# Patient Record
Sex: Female | Born: 1945 | Race: White | Hispanic: No | Marital: Married | Smoking: Former smoker
Health system: Southern US, Community
[De-identification: ages and names within clinical notes are randomized; demographics above are authoritative.]

## PROBLEM LIST (undated history)

## (undated) DIAGNOSIS — I10 Essential (primary) hypertension: Secondary | ICD-10-CM

## (undated) DIAGNOSIS — E785 Hyperlipidemia, unspecified: Secondary | ICD-10-CM

## (undated) HISTORY — PX: PLACEMENT OF BREAST IMPLANTS: SHX6334

---

## 2010-08-23 ENCOUNTER — Encounter: Payer: Self-pay | Admitting: Family Medicine

## 2011-09-16 DIAGNOSIS — I10 Essential (primary) hypertension: Secondary | ICD-10-CM | POA: Diagnosis not present

## 2011-09-16 DIAGNOSIS — E7800 Pure hypercholesterolemia, unspecified: Secondary | ICD-10-CM | POA: Diagnosis not present

## 2012-07-31 DIAGNOSIS — Z23 Encounter for immunization: Secondary | ICD-10-CM | POA: Diagnosis not present

## 2012-11-15 DIAGNOSIS — I10 Essential (primary) hypertension: Secondary | ICD-10-CM | POA: Diagnosis not present

## 2012-11-15 DIAGNOSIS — Z23 Encounter for immunization: Secondary | ICD-10-CM | POA: Diagnosis not present

## 2012-11-15 DIAGNOSIS — E7800 Pure hypercholesterolemia, unspecified: Secondary | ICD-10-CM | POA: Diagnosis not present

## 2012-11-15 DIAGNOSIS — Z1331 Encounter for screening for depression: Secondary | ICD-10-CM | POA: Diagnosis not present

## 2013-01-02 DIAGNOSIS — M109 Gout, unspecified: Secondary | ICD-10-CM | POA: Diagnosis not present

## 2013-06-01 DIAGNOSIS — R7309 Other abnormal glucose: Secondary | ICD-10-CM | POA: Diagnosis not present

## 2013-06-01 DIAGNOSIS — E7800 Pure hypercholesterolemia, unspecified: Secondary | ICD-10-CM | POA: Diagnosis not present

## 2013-06-01 DIAGNOSIS — M109 Gout, unspecified: Secondary | ICD-10-CM | POA: Diagnosis not present

## 2013-06-01 DIAGNOSIS — I10 Essential (primary) hypertension: Secondary | ICD-10-CM | POA: Diagnosis not present

## 2013-06-01 DIAGNOSIS — E782 Mixed hyperlipidemia: Secondary | ICD-10-CM | POA: Diagnosis not present

## 2013-08-17 ENCOUNTER — Other Ambulatory Visit: Payer: Self-pay | Admitting: Family Medicine

## 2013-08-17 ENCOUNTER — Ambulatory Visit
Admission: RE | Admit: 2013-08-17 | Discharge: 2013-08-17 | Disposition: A | Discharge status: Home / Self Care | Payer: Medicare Other | Source: Ambulatory Visit | Attending: Family Medicine | Admitting: Family Medicine

## 2013-08-17 DIAGNOSIS — M79609 Pain in unspecified limb: Secondary | ICD-10-CM | POA: Diagnosis not present

## 2013-08-17 DIAGNOSIS — R609 Edema, unspecified: Secondary | ICD-10-CM

## 2013-08-17 DIAGNOSIS — M7989 Other specified soft tissue disorders: Secondary | ICD-10-CM | POA: Diagnosis not present

## 2013-08-17 DIAGNOSIS — M25539 Pain in unspecified wrist: Secondary | ICD-10-CM | POA: Diagnosis not present

## 2013-08-17 DIAGNOSIS — R52 Pain, unspecified: Secondary | ICD-10-CM

## 2013-09-21 DIAGNOSIS — M25539 Pain in unspecified wrist: Secondary | ICD-10-CM | POA: Diagnosis not present

## 2014-08-30 DIAGNOSIS — Z0000 Encounter for general adult medical examination without abnormal findings: Secondary | ICD-10-CM | POA: Diagnosis not present

## 2014-08-30 DIAGNOSIS — Z1389 Encounter for screening for other disorder: Secondary | ICD-10-CM | POA: Diagnosis not present

## 2014-08-30 DIAGNOSIS — Z9119 Patient's noncompliance with other medical treatment and regimen: Secondary | ICD-10-CM | POA: Diagnosis not present

## 2014-08-30 DIAGNOSIS — N183 Chronic kidney disease, stage 3 (moderate): Secondary | ICD-10-CM | POA: Diagnosis not present

## 2014-08-30 DIAGNOSIS — Z23 Encounter for immunization: Secondary | ICD-10-CM | POA: Diagnosis not present

## 2014-08-30 DIAGNOSIS — R739 Hyperglycemia, unspecified: Secondary | ICD-10-CM | POA: Diagnosis not present

## 2014-08-30 DIAGNOSIS — E780 Pure hypercholesterolemia: Secondary | ICD-10-CM | POA: Diagnosis not present

## 2014-08-30 DIAGNOSIS — I129 Hypertensive chronic kidney disease with stage 1 through stage 4 chronic kidney disease, or unspecified chronic kidney disease: Secondary | ICD-10-CM | POA: Diagnosis not present

## 2014-08-30 DIAGNOSIS — M109 Gout, unspecified: Secondary | ICD-10-CM | POA: Diagnosis not present

## 2015-03-13 DIAGNOSIS — R739 Hyperglycemia, unspecified: Secondary | ICD-10-CM | POA: Diagnosis not present

## 2015-03-13 DIAGNOSIS — E780 Pure hypercholesterolemia: Secondary | ICD-10-CM | POA: Diagnosis not present

## 2015-03-13 DIAGNOSIS — N183 Chronic kidney disease, stage 3 (moderate): Secondary | ICD-10-CM | POA: Diagnosis not present

## 2015-03-13 DIAGNOSIS — I129 Hypertensive chronic kidney disease with stage 1 through stage 4 chronic kidney disease, or unspecified chronic kidney disease: Secondary | ICD-10-CM | POA: Diagnosis not present

## 2015-03-26 IMAGING — CR DG WRIST COMPLETE 3+V*R*
4 series · 4 of 4 positions shown · non-contrast
Comparison: None.

CLINICAL DATA: Medial wrist pain, prior history of gout

EXAM:
RIGHT WRIST - COMPLETE 3+ VIEW

[view not recorded (1 of 4)]
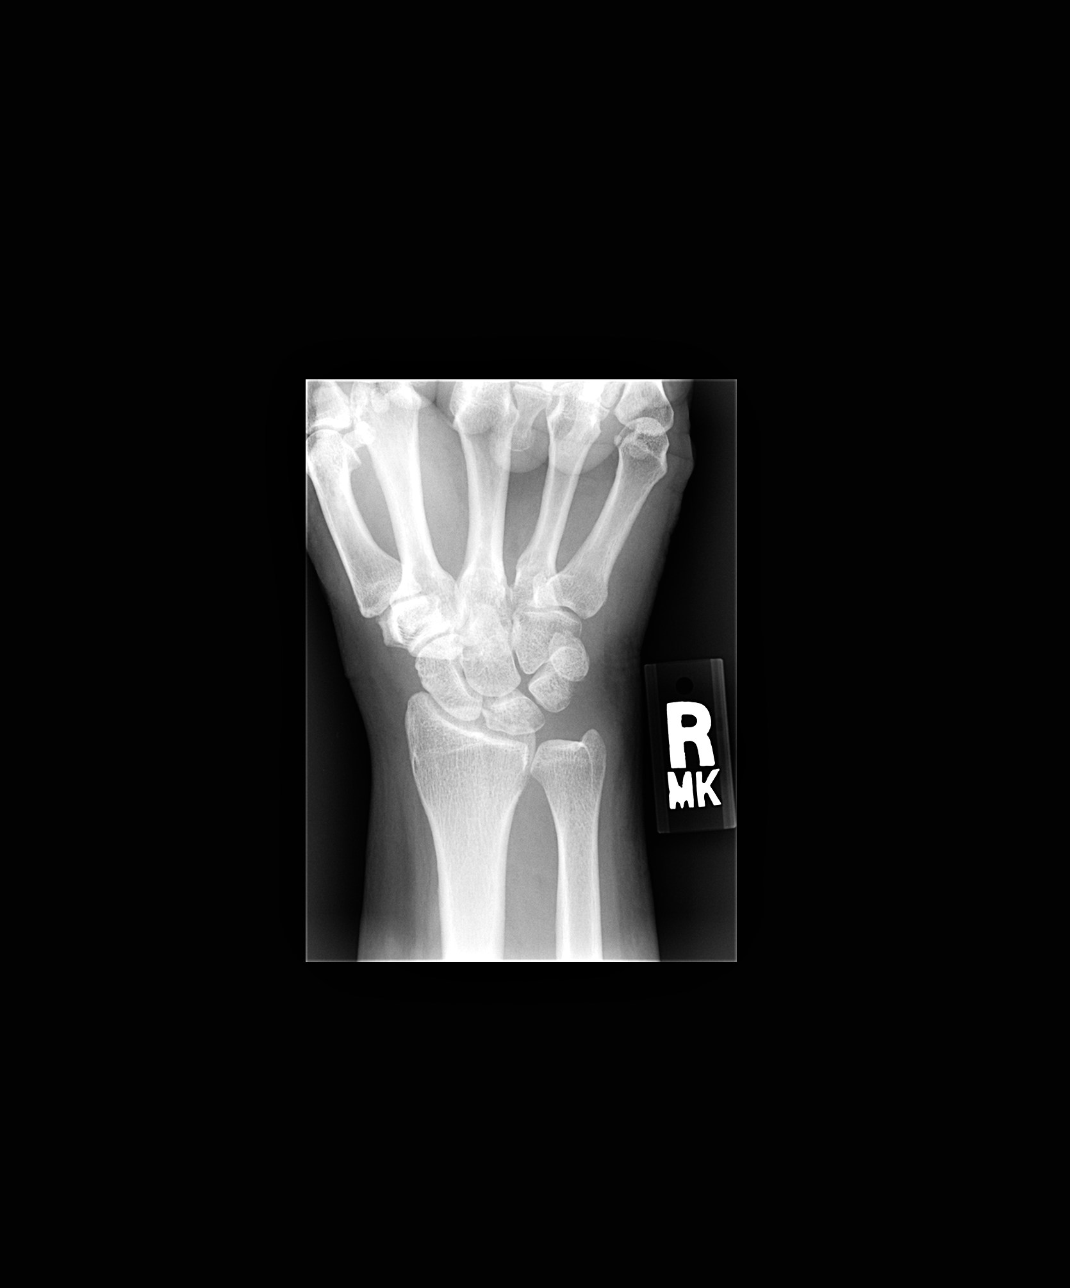

[view not recorded (2 of 4)]
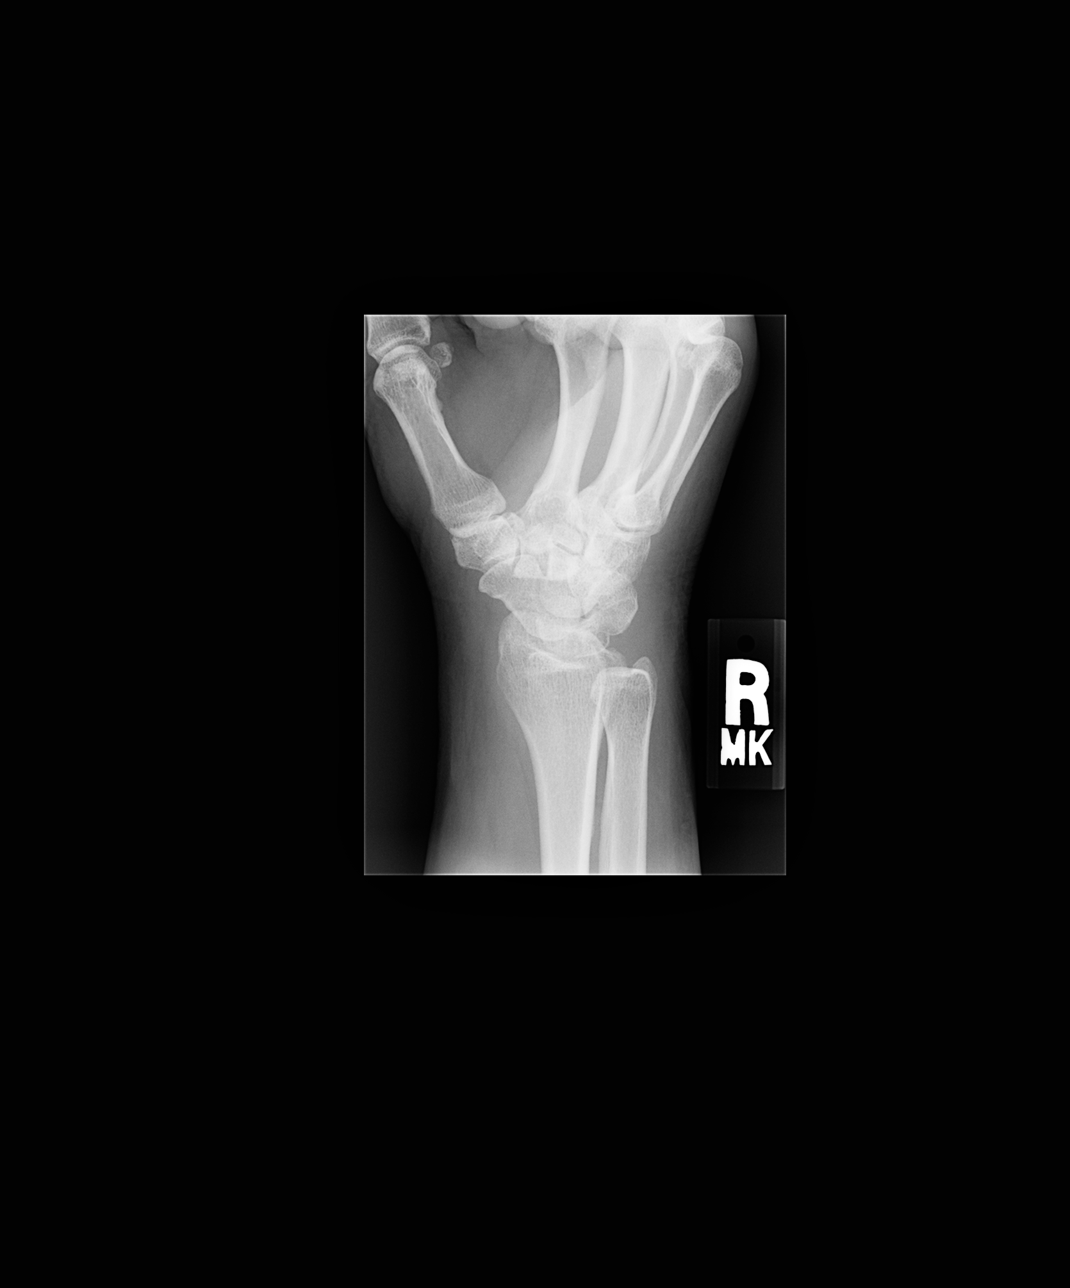

[view not recorded (3 of 4)]
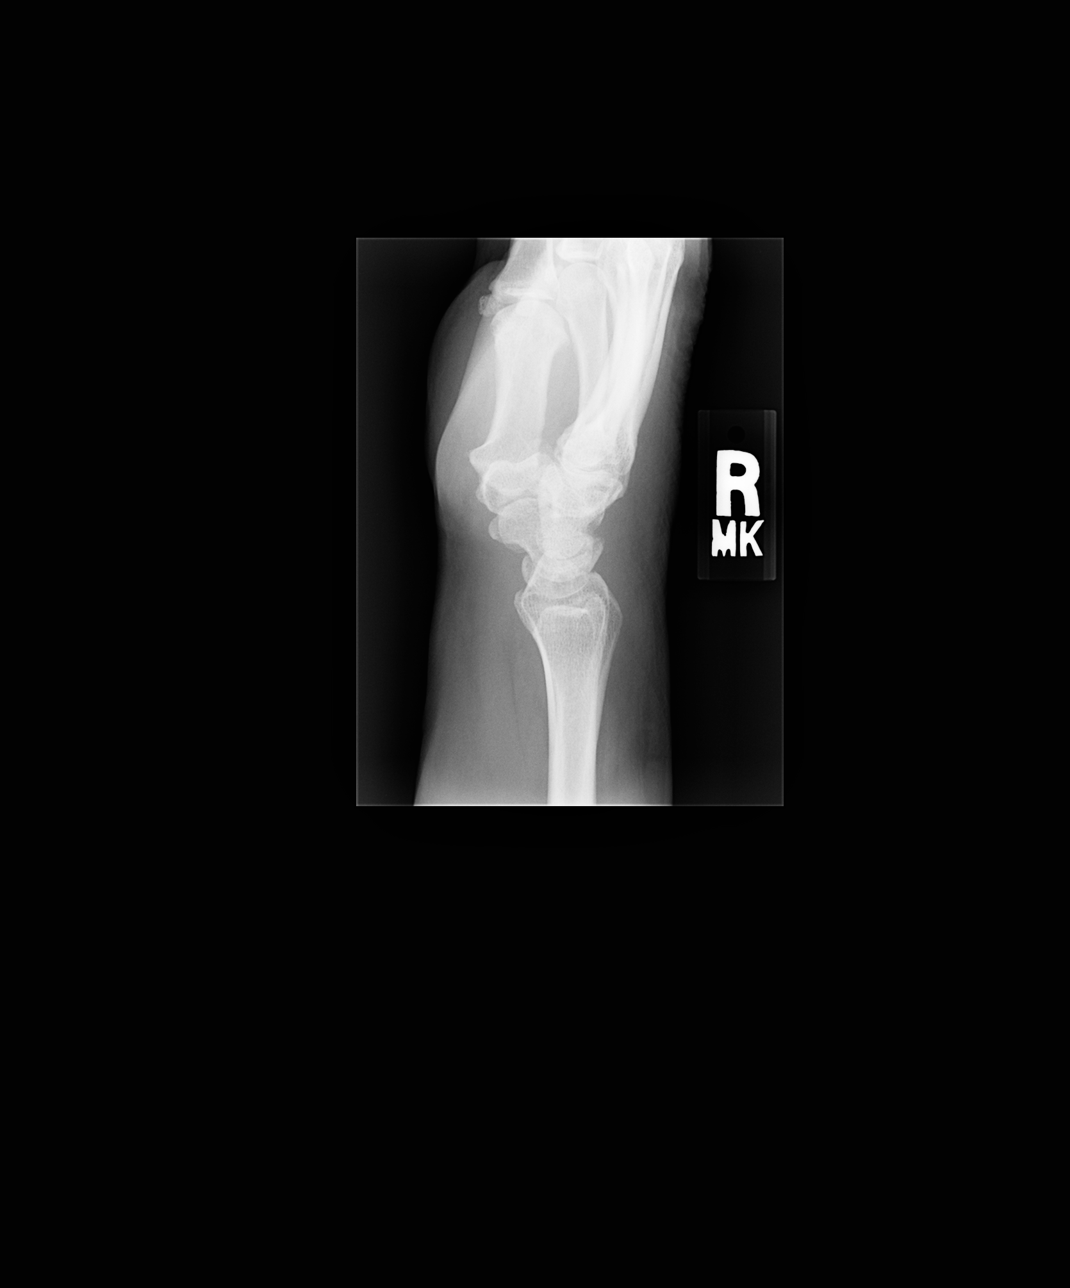

[view not recorded (4 of 4)]
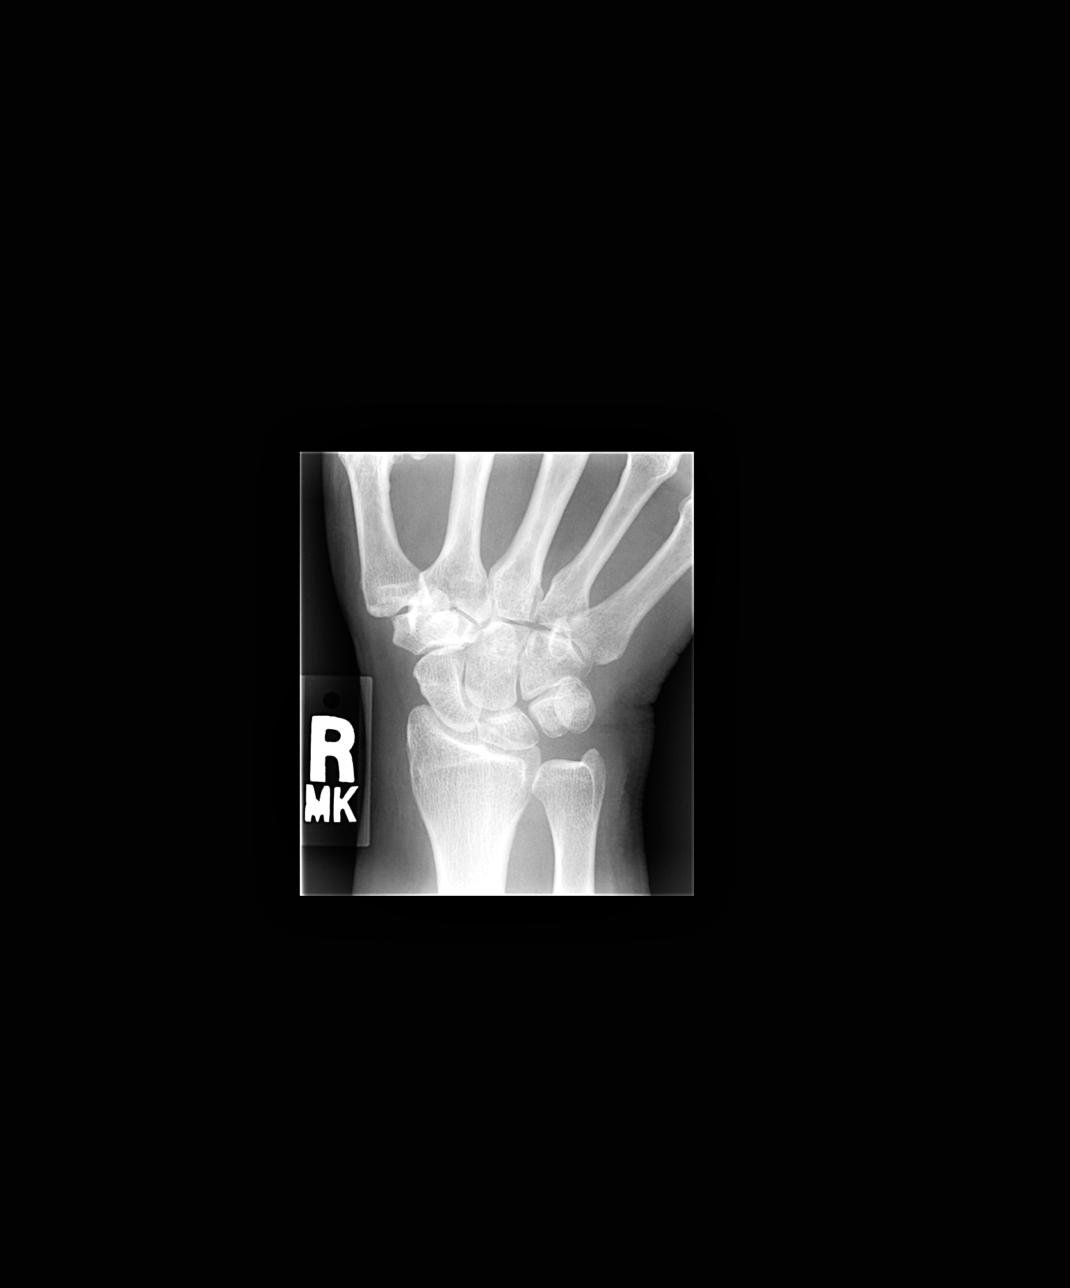

[4 of 4 positions shown; findings below may reference images not displayed]

FINDINGS: No acute fracture or malalignment. The carpus is intact and
congruent. There is a diffuse soft tissue swelling about the wrist.
Normal bony mineralization. No erosive changes.
IMPRESSION: Diffuse soft tissue swelling about the wrist without acute bony
abnormality. If there is clinical concern for soft tissue injury,
consider wrist MRI.

## 2015-05-29 DIAGNOSIS — N183 Chronic kidney disease, stage 3 (moderate): Secondary | ICD-10-CM | POA: Diagnosis not present

## 2015-05-29 DIAGNOSIS — M25431 Effusion, right wrist: Secondary | ICD-10-CM | POA: Diagnosis not present

## 2015-05-29 DIAGNOSIS — M25531 Pain in right wrist: Secondary | ICD-10-CM | POA: Diagnosis not present

## 2015-05-29 DIAGNOSIS — R5383 Other fatigue: Secondary | ICD-10-CM | POA: Diagnosis not present

## 2015-11-12 DIAGNOSIS — I129 Hypertensive chronic kidney disease with stage 1 through stage 4 chronic kidney disease, or unspecified chronic kidney disease: Secondary | ICD-10-CM | POA: Diagnosis not present

## 2015-11-12 DIAGNOSIS — E7800 Pure hypercholesterolemia, unspecified: Secondary | ICD-10-CM | POA: Diagnosis not present

## 2015-11-12 DIAGNOSIS — Z0000 Encounter for general adult medical examination without abnormal findings: Secondary | ICD-10-CM | POA: Diagnosis not present

## 2015-11-12 DIAGNOSIS — N183 Chronic kidney disease, stage 3 (moderate): Secondary | ICD-10-CM | POA: Diagnosis not present

## 2015-11-12 DIAGNOSIS — M109 Gout, unspecified: Secondary | ICD-10-CM | POA: Diagnosis not present

## 2015-11-12 DIAGNOSIS — Z9119 Patient's noncompliance with other medical treatment and regimen: Secondary | ICD-10-CM | POA: Diagnosis not present

## 2015-11-12 DIAGNOSIS — Z1389 Encounter for screening for other disorder: Secondary | ICD-10-CM | POA: Diagnosis not present

## 2015-11-12 DIAGNOSIS — R739 Hyperglycemia, unspecified: Secondary | ICD-10-CM | POA: Diagnosis not present

## 2016-01-13 DIAGNOSIS — I129 Hypertensive chronic kidney disease with stage 1 through stage 4 chronic kidney disease, or unspecified chronic kidney disease: Secondary | ICD-10-CM | POA: Diagnosis not present

## 2016-01-13 DIAGNOSIS — N183 Chronic kidney disease, stage 3 (moderate): Secondary | ICD-10-CM | POA: Diagnosis not present

## 2016-05-20 DIAGNOSIS — E669 Obesity, unspecified: Secondary | ICD-10-CM | POA: Diagnosis not present

## 2016-05-20 DIAGNOSIS — R739 Hyperglycemia, unspecified: Secondary | ICD-10-CM | POA: Diagnosis not present

## 2016-05-20 DIAGNOSIS — N183 Chronic kidney disease, stage 3 (moderate): Secondary | ICD-10-CM | POA: Diagnosis not present

## 2016-05-20 DIAGNOSIS — I129 Hypertensive chronic kidney disease with stage 1 through stage 4 chronic kidney disease, or unspecified chronic kidney disease: Secondary | ICD-10-CM | POA: Diagnosis not present

## 2016-05-20 DIAGNOSIS — E7800 Pure hypercholesterolemia, unspecified: Secondary | ICD-10-CM | POA: Diagnosis not present

## 2016-07-05 DIAGNOSIS — M2550 Pain in unspecified joint: Secondary | ICD-10-CM | POA: Diagnosis not present

## 2016-07-05 DIAGNOSIS — N183 Chronic kidney disease, stage 3 (moderate): Secondary | ICD-10-CM | POA: Diagnosis not present

## 2016-07-05 DIAGNOSIS — M109 Gout, unspecified: Secondary | ICD-10-CM | POA: Diagnosis not present

## 2016-07-05 DIAGNOSIS — I129 Hypertensive chronic kidney disease with stage 1 through stage 4 chronic kidney disease, or unspecified chronic kidney disease: Secondary | ICD-10-CM | POA: Diagnosis not present

## 2016-07-05 DIAGNOSIS — E7800 Pure hypercholesterolemia, unspecified: Secondary | ICD-10-CM | POA: Diagnosis not present

## 2016-07-28 DIAGNOSIS — M109 Gout, unspecified: Secondary | ICD-10-CM | POA: Diagnosis not present

## 2016-09-07 DIAGNOSIS — N183 Chronic kidney disease, stage 3 (moderate): Secondary | ICD-10-CM | POA: Diagnosis not present

## 2016-09-07 DIAGNOSIS — M109 Gout, unspecified: Secondary | ICD-10-CM | POA: Diagnosis not present

## 2016-09-07 DIAGNOSIS — E7800 Pure hypercholesterolemia, unspecified: Secondary | ICD-10-CM | POA: Diagnosis not present

## 2016-09-07 DIAGNOSIS — I129 Hypertensive chronic kidney disease with stage 1 through stage 4 chronic kidney disease, or unspecified chronic kidney disease: Secondary | ICD-10-CM | POA: Diagnosis not present

## 2016-12-14 DIAGNOSIS — R739 Hyperglycemia, unspecified: Secondary | ICD-10-CM | POA: Diagnosis not present

## 2016-12-14 DIAGNOSIS — I129 Hypertensive chronic kidney disease with stage 1 through stage 4 chronic kidney disease, or unspecified chronic kidney disease: Secondary | ICD-10-CM | POA: Diagnosis not present

## 2016-12-14 DIAGNOSIS — Z0000 Encounter for general adult medical examination without abnormal findings: Secondary | ICD-10-CM | POA: Diagnosis not present

## 2016-12-14 DIAGNOSIS — E7800 Pure hypercholesterolemia, unspecified: Secondary | ICD-10-CM | POA: Diagnosis not present

## 2016-12-14 DIAGNOSIS — M109 Gout, unspecified: Secondary | ICD-10-CM | POA: Diagnosis not present

## 2016-12-14 DIAGNOSIS — N183 Chronic kidney disease, stage 3 (moderate): Secondary | ICD-10-CM | POA: Diagnosis not present

## 2016-12-14 DIAGNOSIS — Z1389 Encounter for screening for other disorder: Secondary | ICD-10-CM | POA: Diagnosis not present

## 2017-07-08 DIAGNOSIS — E669 Obesity, unspecified: Secondary | ICD-10-CM | POA: Diagnosis not present

## 2017-07-08 DIAGNOSIS — R739 Hyperglycemia, unspecified: Secondary | ICD-10-CM | POA: Diagnosis not present

## 2017-07-08 DIAGNOSIS — N183 Chronic kidney disease, stage 3 (moderate): Secondary | ICD-10-CM | POA: Diagnosis not present

## 2017-07-08 DIAGNOSIS — I129 Hypertensive chronic kidney disease with stage 1 through stage 4 chronic kidney disease, or unspecified chronic kidney disease: Secondary | ICD-10-CM | POA: Diagnosis not present

## 2017-07-08 DIAGNOSIS — M25569 Pain in unspecified knee: Secondary | ICD-10-CM | POA: Diagnosis not present

## 2018-01-16 DIAGNOSIS — I129 Hypertensive chronic kidney disease with stage 1 through stage 4 chronic kidney disease, or unspecified chronic kidney disease: Secondary | ICD-10-CM | POA: Diagnosis not present

## 2018-01-16 DIAGNOSIS — E7800 Pure hypercholesterolemia, unspecified: Secondary | ICD-10-CM | POA: Diagnosis not present

## 2018-01-16 DIAGNOSIS — Z0000 Encounter for general adult medical examination without abnormal findings: Secondary | ICD-10-CM | POA: Diagnosis not present

## 2018-01-16 DIAGNOSIS — Z1159 Encounter for screening for other viral diseases: Secondary | ICD-10-CM | POA: Diagnosis not present

## 2018-01-16 DIAGNOSIS — M109 Gout, unspecified: Secondary | ICD-10-CM | POA: Diagnosis not present

## 2018-01-16 DIAGNOSIS — Z1389 Encounter for screening for other disorder: Secondary | ICD-10-CM | POA: Diagnosis not present

## 2018-01-16 DIAGNOSIS — R739 Hyperglycemia, unspecified: Secondary | ICD-10-CM | POA: Diagnosis not present

## 2018-01-16 DIAGNOSIS — N183 Chronic kidney disease, stage 3 (moderate): Secondary | ICD-10-CM | POA: Diagnosis not present

## 2018-07-18 DIAGNOSIS — E7800 Pure hypercholesterolemia, unspecified: Secondary | ICD-10-CM | POA: Diagnosis not present

## 2018-07-18 DIAGNOSIS — E161 Other hypoglycemia: Secondary | ICD-10-CM | POA: Diagnosis not present

## 2018-07-18 DIAGNOSIS — N183 Chronic kidney disease, stage 3 (moderate): Secondary | ICD-10-CM | POA: Diagnosis not present

## 2018-07-18 DIAGNOSIS — E669 Obesity, unspecified: Secondary | ICD-10-CM | POA: Diagnosis not present

## 2018-07-18 DIAGNOSIS — M25569 Pain in unspecified knee: Secondary | ICD-10-CM | POA: Diagnosis not present

## 2018-07-18 DIAGNOSIS — I129 Hypertensive chronic kidney disease with stage 1 through stage 4 chronic kidney disease, or unspecified chronic kidney disease: Secondary | ICD-10-CM | POA: Diagnosis not present

## 2018-08-07 DIAGNOSIS — M1711 Unilateral primary osteoarthritis, right knee: Secondary | ICD-10-CM | POA: Diagnosis not present

## 2018-08-07 DIAGNOSIS — M25561 Pain in right knee: Secondary | ICD-10-CM | POA: Diagnosis not present

## 2018-08-07 DIAGNOSIS — M25562 Pain in left knee: Secondary | ICD-10-CM | POA: Diagnosis not present

## 2018-08-07 DIAGNOSIS — M1712 Unilateral primary osteoarthritis, left knee: Secondary | ICD-10-CM | POA: Diagnosis not present

## 2018-08-10 DIAGNOSIS — M170 Bilateral primary osteoarthritis of knee: Secondary | ICD-10-CM | POA: Diagnosis not present

## 2018-08-15 DIAGNOSIS — M170 Bilateral primary osteoarthritis of knee: Secondary | ICD-10-CM | POA: Diagnosis not present

## 2018-08-21 DIAGNOSIS — M170 Bilateral primary osteoarthritis of knee: Secondary | ICD-10-CM | POA: Diagnosis not present

## 2018-08-24 DIAGNOSIS — M170 Bilateral primary osteoarthritis of knee: Secondary | ICD-10-CM | POA: Diagnosis not present

## 2018-08-28 DIAGNOSIS — M170 Bilateral primary osteoarthritis of knee: Secondary | ICD-10-CM | POA: Diagnosis not present

## 2018-08-31 DIAGNOSIS — M170 Bilateral primary osteoarthritis of knee: Secondary | ICD-10-CM | POA: Diagnosis not present

## 2018-09-04 DIAGNOSIS — M170 Bilateral primary osteoarthritis of knee: Secondary | ICD-10-CM | POA: Diagnosis not present

## 2018-09-13 DIAGNOSIS — M170 Bilateral primary osteoarthritis of knee: Secondary | ICD-10-CM | POA: Diagnosis not present

## 2018-09-22 DIAGNOSIS — L812 Freckles: Secondary | ICD-10-CM | POA: Diagnosis not present

## 2018-09-22 DIAGNOSIS — L821 Other seborrheic keratosis: Secondary | ICD-10-CM | POA: Diagnosis not present

## 2018-09-22 DIAGNOSIS — L820 Inflamed seborrheic keratosis: Secondary | ICD-10-CM | POA: Diagnosis not present

## 2019-03-16 DIAGNOSIS — E7800 Pure hypercholesterolemia, unspecified: Secondary | ICD-10-CM | POA: Diagnosis not present

## 2019-03-16 DIAGNOSIS — M109 Gout, unspecified: Secondary | ICD-10-CM | POA: Diagnosis not present

## 2019-03-16 DIAGNOSIS — E669 Obesity, unspecified: Secondary | ICD-10-CM | POA: Diagnosis not present

## 2019-03-16 DIAGNOSIS — I129 Hypertensive chronic kidney disease with stage 1 through stage 4 chronic kidney disease, or unspecified chronic kidney disease: Secondary | ICD-10-CM | POA: Diagnosis not present

## 2019-03-16 DIAGNOSIS — R7303 Prediabetes: Secondary | ICD-10-CM | POA: Diagnosis not present

## 2019-03-16 DIAGNOSIS — Z0000 Encounter for general adult medical examination without abnormal findings: Secondary | ICD-10-CM | POA: Diagnosis not present

## 2019-03-16 DIAGNOSIS — Z1389 Encounter for screening for other disorder: Secondary | ICD-10-CM | POA: Diagnosis not present

## 2019-03-16 DIAGNOSIS — N183 Chronic kidney disease, stage 3 (moderate): Secondary | ICD-10-CM | POA: Diagnosis not present

## 2019-03-22 DIAGNOSIS — M109 Gout, unspecified: Secondary | ICD-10-CM | POA: Diagnosis not present

## 2019-03-22 DIAGNOSIS — E7800 Pure hypercholesterolemia, unspecified: Secondary | ICD-10-CM | POA: Diagnosis not present

## 2019-03-22 DIAGNOSIS — R7303 Prediabetes: Secondary | ICD-10-CM | POA: Diagnosis not present

## 2019-03-22 DIAGNOSIS — Z0000 Encounter for general adult medical examination without abnormal findings: Secondary | ICD-10-CM | POA: Diagnosis not present

## 2019-09-19 DIAGNOSIS — N183 Chronic kidney disease, stage 3 unspecified: Secondary | ICD-10-CM | POA: Diagnosis not present

## 2019-09-19 DIAGNOSIS — E7800 Pure hypercholesterolemia, unspecified: Secondary | ICD-10-CM | POA: Diagnosis not present

## 2019-09-19 DIAGNOSIS — E669 Obesity, unspecified: Secondary | ICD-10-CM | POA: Diagnosis not present

## 2019-09-19 DIAGNOSIS — I129 Hypertensive chronic kidney disease with stage 1 through stage 4 chronic kidney disease, or unspecified chronic kidney disease: Secondary | ICD-10-CM | POA: Diagnosis not present

## 2019-09-19 DIAGNOSIS — Z7189 Other specified counseling: Secondary | ICD-10-CM | POA: Diagnosis not present

## 2019-09-19 DIAGNOSIS — R7303 Prediabetes: Secondary | ICD-10-CM | POA: Diagnosis not present

## 2019-09-25 DIAGNOSIS — I129 Hypertensive chronic kidney disease with stage 1 through stage 4 chronic kidney disease, or unspecified chronic kidney disease: Secondary | ICD-10-CM | POA: Diagnosis not present

## 2019-09-25 DIAGNOSIS — R7303 Prediabetes: Secondary | ICD-10-CM | POA: Diagnosis not present

## 2019-09-25 DIAGNOSIS — E7800 Pure hypercholesterolemia, unspecified: Secondary | ICD-10-CM | POA: Diagnosis not present

## 2020-03-20 DIAGNOSIS — Z0000 Encounter for general adult medical examination without abnormal findings: Secondary | ICD-10-CM | POA: Diagnosis not present

## 2020-03-20 DIAGNOSIS — R7303 Prediabetes: Secondary | ICD-10-CM | POA: Diagnosis not present

## 2020-03-20 DIAGNOSIS — N183 Chronic kidney disease, stage 3 unspecified: Secondary | ICD-10-CM | POA: Diagnosis not present

## 2020-03-20 DIAGNOSIS — M109 Gout, unspecified: Secondary | ICD-10-CM | POA: Diagnosis not present

## 2020-03-20 DIAGNOSIS — Z1389 Encounter for screening for other disorder: Secondary | ICD-10-CM | POA: Diagnosis not present

## 2020-03-20 DIAGNOSIS — E669 Obesity, unspecified: Secondary | ICD-10-CM | POA: Diagnosis not present

## 2020-03-20 DIAGNOSIS — Z9119 Patient's noncompliance with other medical treatment and regimen: Secondary | ICD-10-CM | POA: Diagnosis not present

## 2020-03-20 DIAGNOSIS — I129 Hypertensive chronic kidney disease with stage 1 through stage 4 chronic kidney disease, or unspecified chronic kidney disease: Secondary | ICD-10-CM | POA: Diagnosis not present

## 2020-03-20 DIAGNOSIS — E7800 Pure hypercholesterolemia, unspecified: Secondary | ICD-10-CM | POA: Diagnosis not present

## 2020-05-28 ENCOUNTER — Ambulatory Visit: Payer: Medicare Other | Attending: Internal Medicine

## 2020-05-28 ENCOUNTER — Other Ambulatory Visit (HOSPITAL_COMMUNITY): Payer: Self-pay | Admitting: Internal Medicine

## 2020-05-28 DIAGNOSIS — Z23 Encounter for immunization: Secondary | ICD-10-CM

## 2020-05-28 NOTE — Progress Notes (Signed)
   Covid-19 Vaccination Clinic  Name:  Deborah Logan    MRN: [id number] DOB: 1945/11/06  05/28/2020  Deborah Logan was observed post Covid-19 immunization for 15 minutes without incident. She was provided with Vaccine Information Sheet and instruction to access the V-Safe system.   Deborah Logan was instructed to call 911 with any severe reactions post vaccine: Marland Kitchen Difficulty breathing  . Swelling of face and throat  . A fast heartbeat  . A bad rash all over body  . Dizziness and weakness

## 2020-09-22 DIAGNOSIS — E7800 Pure hypercholesterolemia, unspecified: Secondary | ICD-10-CM | POA: Diagnosis not present

## 2020-09-22 DIAGNOSIS — R7303 Prediabetes: Secondary | ICD-10-CM | POA: Diagnosis not present

## 2020-09-22 DIAGNOSIS — N183 Chronic kidney disease, stage 3 unspecified: Secondary | ICD-10-CM | POA: Diagnosis not present

## 2020-09-22 DIAGNOSIS — I129 Hypertensive chronic kidney disease with stage 1 through stage 4 chronic kidney disease, or unspecified chronic kidney disease: Secondary | ICD-10-CM | POA: Diagnosis not present

## 2020-12-20 DIAGNOSIS — Z23 Encounter for immunization: Secondary | ICD-10-CM | POA: Diagnosis not present

## 2021-04-21 DIAGNOSIS — N183 Chronic kidney disease, stage 3 unspecified: Secondary | ICD-10-CM | POA: Diagnosis not present

## 2021-04-21 DIAGNOSIS — E7800 Pure hypercholesterolemia, unspecified: Secondary | ICD-10-CM | POA: Diagnosis not present

## 2021-04-21 DIAGNOSIS — M109 Gout, unspecified: Secondary | ICD-10-CM | POA: Diagnosis not present

## 2021-04-21 DIAGNOSIS — E669 Obesity, unspecified: Secondary | ICD-10-CM | POA: Diagnosis not present

## 2021-04-21 DIAGNOSIS — Z0000 Encounter for general adult medical examination without abnormal findings: Secondary | ICD-10-CM | POA: Diagnosis not present

## 2021-04-21 DIAGNOSIS — I129 Hypertensive chronic kidney disease with stage 1 through stage 4 chronic kidney disease, or unspecified chronic kidney disease: Secondary | ICD-10-CM | POA: Diagnosis not present

## 2021-04-21 DIAGNOSIS — Z1389 Encounter for screening for other disorder: Secondary | ICD-10-CM | POA: Diagnosis not present

## 2021-04-21 DIAGNOSIS — R7303 Prediabetes: Secondary | ICD-10-CM | POA: Diagnosis not present

## 2021-04-21 DIAGNOSIS — R2689 Other abnormalities of gait and mobility: Secondary | ICD-10-CM | POA: Diagnosis not present

## 2021-05-14 DIAGNOSIS — Z23 Encounter for immunization: Secondary | ICD-10-CM | POA: Diagnosis not present

## 2021-10-09 DIAGNOSIS — N183 Chronic kidney disease, stage 3 unspecified: Secondary | ICD-10-CM | POA: Diagnosis not present

## 2021-10-09 DIAGNOSIS — E7800 Pure hypercholesterolemia, unspecified: Secondary | ICD-10-CM | POA: Diagnosis not present

## 2021-10-09 DIAGNOSIS — R7303 Prediabetes: Secondary | ICD-10-CM | POA: Diagnosis not present

## 2021-10-09 DIAGNOSIS — I129 Hypertensive chronic kidney disease with stage 1 through stage 4 chronic kidney disease, or unspecified chronic kidney disease: Secondary | ICD-10-CM | POA: Diagnosis not present

## 2021-12-09 DIAGNOSIS — G720 Drug-induced myopathy: Secondary | ICD-10-CM | POA: Diagnosis not present

## 2021-12-09 DIAGNOSIS — R7303 Prediabetes: Secondary | ICD-10-CM | POA: Diagnosis not present

## 2021-12-09 DIAGNOSIS — E7800 Pure hypercholesterolemia, unspecified: Secondary | ICD-10-CM | POA: Diagnosis not present

## 2022-04-15 DIAGNOSIS — L821 Other seborrheic keratosis: Secondary | ICD-10-CM | POA: Diagnosis not present

## 2022-04-23 DIAGNOSIS — Z1211 Encounter for screening for malignant neoplasm of colon: Secondary | ICD-10-CM | POA: Diagnosis not present

## 2022-04-23 DIAGNOSIS — R7309 Other abnormal glucose: Secondary | ICD-10-CM | POA: Diagnosis not present

## 2022-04-23 DIAGNOSIS — N183 Chronic kidney disease, stage 3 unspecified: Secondary | ICD-10-CM | POA: Diagnosis not present

## 2022-04-23 DIAGNOSIS — M109 Gout, unspecified: Secondary | ICD-10-CM | POA: Diagnosis not present

## 2022-04-23 DIAGNOSIS — R7303 Prediabetes: Secondary | ICD-10-CM | POA: Diagnosis not present

## 2022-04-23 DIAGNOSIS — E7800 Pure hypercholesterolemia, unspecified: Secondary | ICD-10-CM | POA: Diagnosis not present

## 2022-04-23 DIAGNOSIS — Z1331 Encounter for screening for depression: Secondary | ICD-10-CM | POA: Diagnosis not present

## 2022-04-23 DIAGNOSIS — Z0000 Encounter for general adult medical examination without abnormal findings: Secondary | ICD-10-CM | POA: Diagnosis not present

## 2022-04-23 DIAGNOSIS — I129 Hypertensive chronic kidney disease with stage 1 through stage 4 chronic kidney disease, or unspecified chronic kidney disease: Secondary | ICD-10-CM | POA: Diagnosis not present

## 2022-04-23 DIAGNOSIS — E669 Obesity, unspecified: Secondary | ICD-10-CM | POA: Diagnosis not present

## 2022-05-17 DIAGNOSIS — Z23 Encounter for immunization: Secondary | ICD-10-CM | POA: Diagnosis not present

## 2022-06-12 ENCOUNTER — Encounter (HOSPITAL_COMMUNITY): Payer: Self-pay

## 2022-06-12 ENCOUNTER — Emergency Department (HOSPITAL_COMMUNITY): Payer: Medicare Other

## 2022-06-12 ENCOUNTER — Observation Stay (HOSPITAL_COMMUNITY)
Admission: EM | Admit: 2022-06-12 | Discharge: 2022-06-14 | Disposition: A | Discharge status: Home / Self Care | Payer: Medicare Other | Attending: Internal Medicine | Admitting: Internal Medicine

## 2022-06-12 ENCOUNTER — Other Ambulatory Visit: Payer: Self-pay

## 2022-06-12 DIAGNOSIS — R7989 Other specified abnormal findings of blood chemistry: Secondary | ICD-10-CM

## 2022-06-12 DIAGNOSIS — N1832 Chronic kidney disease, stage 3b: Secondary | ICD-10-CM | POA: Diagnosis not present

## 2022-06-12 DIAGNOSIS — I10 Essential (primary) hypertension: Secondary | ICD-10-CM

## 2022-06-12 DIAGNOSIS — R079 Chest pain, unspecified: Secondary | ICD-10-CM | POA: Diagnosis not present

## 2022-06-12 DIAGNOSIS — Z79899 Other long term (current) drug therapy: Secondary | ICD-10-CM | POA: Insufficient documentation

## 2022-06-12 DIAGNOSIS — I129 Hypertensive chronic kidney disease with stage 1 through stage 4 chronic kidney disease, or unspecified chronic kidney disease: Secondary | ICD-10-CM | POA: Diagnosis not present

## 2022-06-12 DIAGNOSIS — I161 Hypertensive emergency: Secondary | ICD-10-CM | POA: Diagnosis not present

## 2022-06-12 DIAGNOSIS — R001 Bradycardia, unspecified: Secondary | ICD-10-CM | POA: Diagnosis not present

## 2022-06-12 DIAGNOSIS — I160 Hypertensive urgency: Principal | ICD-10-CM | POA: Insufficient documentation

## 2022-06-12 DIAGNOSIS — Z87891 Personal history of nicotine dependence: Secondary | ICD-10-CM | POA: Insufficient documentation

## 2022-06-12 DIAGNOSIS — R0789 Other chest pain: Secondary | ICD-10-CM | POA: Diagnosis not present

## 2022-06-12 DIAGNOSIS — I493 Ventricular premature depolarization: Secondary | ICD-10-CM | POA: Diagnosis not present

## 2022-06-12 DIAGNOSIS — E785 Hyperlipidemia, unspecified: Secondary | ICD-10-CM

## 2022-06-12 DIAGNOSIS — R778 Other specified abnormalities of plasma proteins: Secondary | ICD-10-CM | POA: Diagnosis not present

## 2022-06-12 DIAGNOSIS — R0602 Shortness of breath: Secondary | ICD-10-CM | POA: Diagnosis not present

## 2022-06-12 HISTORY — DX: Hyperlipidemia, unspecified: E78.5

## 2022-06-12 HISTORY — DX: Essential (primary) hypertension: I10

## 2022-06-12 HISTORY — DX: Chronic kidney disease, stage 3b: N18.32

## 2022-06-12 LAB — CBC WITH DIFFERENTIAL/PLATELET
Abs Immature Granulocytes: 0.04 10*3/uL (ref 0.00–0.07)
Basophils Absolute: 0.1 10*3/uL (ref 0.0–0.1)
Basophils Relative: 1 %
Eosinophils Absolute: 0.2 10*3/uL (ref 0.0–0.5)
Eosinophils Relative: 2 %
HCT: 42.4 % (ref 36.0–46.0)
Hemoglobin: 13.9 g/dL (ref 12.0–15.0)
Immature Granulocytes: 0 %
Lymphocytes Relative: 12 %
Lymphs Abs: 1.6 10*3/uL (ref 0.7–4.0)
MCH: 30.3 pg (ref 26.0–34.0)
MCHC: 32.8 g/dL (ref 30.0–36.0)
MCV: 92.4 fL (ref 80.0–100.0)
Monocytes Absolute: 0.8 10*3/uL (ref 0.1–1.0)
Monocytes Relative: 6 %
Neutro Abs: 10.8 10*3/uL — ABNORMAL HIGH (ref 1.7–7.7)
Neutrophils Relative %: 79 %
Platelets: 383 10*3/uL (ref 150–400)
RBC: 4.59 MIL/uL (ref 3.87–5.11)
RDW: 13.2 % (ref 11.5–15.5)
WBC: 13.5 10*3/uL — ABNORMAL HIGH (ref 4.0–10.5)
nRBC: 0 % (ref 0.0–0.2)

## 2022-06-12 LAB — BASIC METABOLIC PANEL
Anion gap: 12 (ref 5–15)
BUN: 25 mg/dL — ABNORMAL HIGH (ref 8–23)
CO2: 21 mmol/L — ABNORMAL LOW (ref 22–32)
Calcium: 9.7 mg/dL (ref 8.9–10.3)
Chloride: 106 mmol/L (ref 98–111)
Creatinine, Ser: 1.27 mg/dL — ABNORMAL HIGH (ref 0.44–1.00)
GFR, Estimated: 44 mL/min — ABNORMAL LOW (ref >60)
Glucose, Bld: 166 mg/dL — ABNORMAL HIGH (ref 70–99)
Potassium: 4.4 mmol/L (ref 3.5–5.1)
Sodium: 139 mmol/L (ref 135–145)

## 2022-06-12 LAB — MAGNESIUM: Magnesium: 1.9 mg/dL (ref 1.7–2.4)

## 2022-06-12 LAB — TROPONIN I (HIGH SENSITIVITY)
Troponin I (High Sensitivity): 14 ng/L (ref <18)
Troponin I (High Sensitivity): 18 ng/L — ABNORMAL HIGH (ref <18)
Troponin I (High Sensitivity): 21 ng/L — ABNORMAL HIGH (ref <18)

## 2022-06-12 LAB — BRAIN NATRIURETIC PEPTIDE: B Natriuretic Peptide: 928.8 pg/mL — ABNORMAL HIGH (ref 0.0–100.0)

## 2022-06-12 LAB — D-DIMER, QUANTITATIVE: D-Dimer, Quant: 0.93 ug/mL-FEU — ABNORMAL HIGH (ref 0.00–0.50)

## 2022-06-12 MED ORDER — HYDRALAZINE HCL 10 MG PO TABS
10.0000 mg | ORAL_TABLET | Freq: Once | ORAL | Status: AC
  Administered 2022-06-12: 10 mg via ORAL
  Filled 2022-06-12: qty 1

## 2022-06-12 MED ORDER — HYDRALAZINE HCL 25 MG PO TABS
25.0000 mg | ORAL_TABLET | Freq: Once | ORAL | Status: AC
  Administered 2022-06-12: 25 mg via ORAL
  Filled 2022-06-12: qty 1

## 2022-06-12 MED ORDER — IOHEXOL 350 MG/ML SOLN
60.0000 mL | Freq: Once | INTRAVENOUS | Status: AC | PRN
  Administered 2022-06-12: 60 mL via INTRAVENOUS

## 2022-06-12 MED ORDER — NITROGLYCERIN 0.4 MG SL SUBL
0.4000 mg | SUBLINGUAL_TABLET | SUBLINGUAL | Status: DC | PRN
  Administered 2022-06-12 (×2): 0.4 mg via SUBLINGUAL
  Filled 2022-06-12 (×2): qty 1

## 2022-06-12 MED ORDER — ASPIRIN 81 MG PO CHEW
324.0000 mg | CHEWABLE_TABLET | Freq: Once | ORAL | Status: AC
  Administered 2022-06-12: 324 mg via ORAL
  Filled 2022-06-12: qty 4

## 2022-06-12 NOTE — ED Notes (Signed)
Pt given snacks and water per MD

## 2022-06-12 NOTE — Assessment & Plan Note (Signed)
Continue atenolol and benazepril.

## 2022-06-12 NOTE — ED Provider Triage Note (Signed)
Emergency Medicine Provider Triage Evaluation Note  Deborah Logan , a 76 y.o. female  was evaluated in triage.  Pt complains of chest pain.  Located under left breast.  Does not radiate to left arm, left jaw.  Associated shortness of breath.  Pain worse with exertion, resolves with rest.  No history of similar.  No pain or swelling to calves.  No history of PE or DVT.  Review of Systems  Positive: CP, SOB Negative: Le edema, pain  Physical Exam  There were no vitals taken for this visit. Gen:   Awake, no distress   Resp:  Normal effort  MSK:   Moves extremities without difficulty  Other:    Medical Decision Making  Medically screening exam initiated at 3:48 PM.  Appropriate orders placed.  Deborah Logan was informed that the remainder of the evaluation will be completed by another provider, this initial triage assessment does not replace that evaluation, and the importance of remaining in the ED until their evaluation is complete.  CP   Kenyah Luba A, PA-C 06/12/22 1549

## 2022-06-12 NOTE — Assessment & Plan Note (Addendum)
Admit to observation med/tele bed. Pt with clear lung exam. No peripheral edema. Does not have physical signs of volume overload. Will check echo. Need to get better blood control. Pt does admit to missing her medications(atenolol and benazepril) yesterday and taking ibuprofen and ASA for the last 3 days. Add prn IV hydralazine to her home meds. CTPA suggests pulmonary HTN. Check echo.

## 2022-06-12 NOTE — H&P (Signed)
History and Physical    Deborah Logan [id number] DOB: 10-Oct-1945 DOA: 06/12/2022  DOS: the patient was seen and examined on 06/12/2022  PCP: Pcp, No   Patient coming from: Home  I have personally briefly reviewed patient's old medical records in [place]  CC: chest discomfort HPI: 6 76-year-old Caucasian female history of hypertension, hyperlipidemia, CKD stage IIIb presents to the ER today with continued chest pain for the last 3 days.  Patient states last week she was painting her porch.  She thinks she may have overworked her muscles.  She has been having left-sided chest discomfort but no pain for the last 3 days.  She has been taking ibuprofen and aspirin.  She missed a dose of her atenolol and benazepril she takes for hypertension yesterday.  She states that due to continued unrelenting chest discomfort, she presented to the urgent care for evaluation.  She was sent to the ER for evaluation.  Patient denies any shortness of breath.  No nausea.  No vomiting.  No diaphoresis.  No radiation of her chest discomfort to her arms, neck, jaw.  She has no prior history of coronary disease.  On arrival to the ER, temp 98.7 heart rate 70 blood pressure 218/71.  9 9% on room air.  Labs showed a BNP of 928  Magnesium 1.9  D-dimer slightly elevated 0.93  BUN of 25, creatinine 1.27  White count 13.5, hemoglobin 13.9, platelets of 383  CTPA was negative for PE.  There were central pulmonary artery enlargement consistent with pulmonary artery hypertension.  There is cardiomegaly.  EKG shows normal sinus rhythm with occasional bigeminy.    ED Course: BNP 928, CXR negative for pulmonary edema, CTPA negative for PE  Review of Systems:  Review of Systems  Constitutional: Negative.   HENT: Negative.    Eyes: Negative.   Respiratory:  Negative for shortness of breath.   Cardiovascular:  Negative for palpitations, orthopnea, leg swelling and PND.       Chest discomfort but  no pain.  Gastrointestinal: Negative.  Negative for abdominal pain, diarrhea and vomiting.  Genitourinary: Negative.   Musculoskeletal: Negative.   Skin: Negative.   Neurological: Negative.   Endo/Heme/Allergies: Negative.   Psychiatric/Behavioral: Negative.    All other systems reviewed and are negative.   Past Medical History:  Diagnosis Date   HLD (hyperlipidemia)    HTN (hypertension) 06/12/2022   Hyperlipidemia 06/12/2022   Hypertension    Stage 3b chronic kidney disease (CKD) ([place]) 06/12/2022    Past Surgical History:  Procedure Laterality Date   PLACEMENT OF BREAST IMPLANTS Bilateral      reports that she has quit smoking. Her smoking use included cigarettes. She has never used smokeless tobacco. She reports current alcohol use. She reports that she does not use drugs.  No Known Allergies  No family history on file.  Prior to Admission medications   Medication Sig Start Date End Date Taking? Authorizing Provider  allopurinol (ZYLOPRIM) 100 MG tablet Take 100 mg by mouth daily.   Yes [provider]  atenolol (TENORMIN) 50 MG tablet Take 50 mg by mouth daily.   Yes [provider]  benazepril (LOTENSIN) 40 MG tablet Take 40 mg by mouth daily.   Yes [provider]  rosuvastatin (CRESTOR) 5 MG tablet Take 5 mg by mouth daily. 04/19/22  Yes [provider]  vitamin D3 (CHOLECALCIFEROL) 25 MCG tablet Take 1,000 Units by mouth daily.   Yes [provider]  Physical Exam: Vitals:   06/12/22 2015 06/12/22 2209 06/12/22 2230 06/12/22 2245  BP: (!) 191/77 (!) 188/94 (!) 159/72 (!) 155/54  Pulse: 60 62 72 (!) 58  Resp: 12 15 17 14   Temp:      TempSrc:      SpO2: 96% 97% 98% 98%    Physical Exam Vitals and nursing note reviewed.  Constitutional:      General: She is not in acute distress.    Appearance: Normal appearance. She is obese. She is not ill-appearing, toxic-appearing or diaphoretic.  HENT:     Head:  Normocephalic and atraumatic.     Nose: Nose normal.  Eyes:     General: No scleral icterus. Cardiovascular:     Rate and Rhythm: Normal rate and regular rhythm.     Pulses: Normal pulses.     Heart sounds: No murmur heard.    No gallop.  Pulmonary:     Effort: Pulmonary effort is normal. No respiratory distress.     Breath sounds: Normal breath sounds. No wheezing or rales.  Abdominal:     General: Bowel sounds are normal. There is no distension.     Palpations: Abdomen is soft.     Tenderness: There is no abdominal tenderness. There is no guarding or rebound.  Musculoskeletal:     Right lower leg: No edema.     Left lower leg: No edema.  Skin:    General: Skin is warm and dry.     Capillary Refill: Capillary refill takes less than 2 seconds.  Neurological:     General: No focal deficit present.     Mental Status: She is alert and oriented to person, place, and time.      Labs on Admission: I have personally reviewed following labs and imaging studies  CBC: Recent Labs  Lab 06/12/22 1600  WBC 13.5*  NEUTROABS 10.8*  HGB 13.9  HCT 42.4  MCV 92.4  PLT [id number]   Basic Metabolic Panel: Recent Labs  Lab 06/12/22 1600 06/12/22 1808  NA 139  --   K 4.4  --   CL 106  --   CO2 21*  --   GLUCOSE 166*  --   BUN 25*  --   CREATININE 1.27*  --   CALCIUM 9.7  --   MG  --  1.9   GFR: CrCl cannot be calculated (Unknown ideal weight.). Liver Function Tests: No results for input(s): "AST", "ALT", "ALKPHOS", "BILITOT", "PROT", "ALBUMIN" in the last 168 hours. No results for input(s): "LIPASE", "AMYLASE" in the last 168 hours. No results for input(s): "AMMONIA" in the last 168 hours. Coagulation Profile: No results for input(s): "INR", "PROTIME" in the last 168 hours. Cardiac Enzymes: Recent Labs  Lab 06/12/22 1600 06/12/22 1808 06/12/22 2213  TROPONINIHS 14 18* 21*   BNP (last 3 results) No results for input(s): "PROBNP" in the last 8760 hours. HbA1C: No results  for input(s): "HGBA1C" in the last 72 hours. CBG: No results for input(s): "GLUCAP" in the last 168 hours. Lipid Profile: No results for input(s): "CHOL", "HDL", "LDLCALC", "TRIG", "CHOLHDL", "LDLDIRECT" in the last 72 hours. Thyroid Function Tests: No results for input(s): "TSH", "T4TOTAL", "FREET4", "T3FREE", "THYROIDAB" in the last 72 hours. Anemia Panel: No results for input(s): "VITAMINB12", "FOLATE", "FERRITIN", "TIBC", "IRON", "RETICCTPCT" in the last 72 hours. Urine analysis: No results found for: "COLORURINE", "APPEARANCEUR", "LABSPEC", "PHURINE", "GLUCOSEU", "HGBUR", "BILIRUBINUR", "KETONESUR", "PROTEINUR", "UROBILINOGEN", "NITRITE", "LEUKOCYTESUR"  Radiological Exams on Admission: I have personally reviewed images  CT Angio Chest PE W and/or Wo Contrast  Result Date: 06/12/2022 CLINICAL DATA:  Pulmonary embolism (PE) suspected, positive D-dimer. Chest pain. EXAM: CT ANGIOGRAPHY CHEST WITH CONTRAST TECHNIQUE: Multidetector CT imaging of the chest was performed using the standard protocol during bolus administration of intravenous contrast. Multiplanar CT image reconstructions and MIPs were obtained to evaluate the vascular anatomy. RADIATION DOSE REDUCTION: This exam was performed according to the departmental dose-optimization program which includes automated exposure control, adjustment of the mA and/or kV according to patient size and/or use of iterative reconstruction technique. CONTRAST:  6mL OMNIPAQUE IOHEXOL 350 MG/ML SOLN COMPARISON:  None Available. FINDINGS: Cardiovascular: There is adequate opacification of the pulmonary arterial tree. No intraluminal filling defect identified to suggest acute pulmonary embolism. Central pulmonary arteries are enlarged in keeping with changes of pulmonary arterial hypertension. Mild global cardiomegaly. Mild coronary artery calcification. No pericardial effusion. Mild atherosclerotic calcification within the thoracic aorta. No aortic aneurysm.  Mediastinum/Nodes: No enlarged mediastinal, hilar, or axillary lymph nodes. Thyroid gland, trachea, and esophagus demonstrate no significant findings. Lungs/Pleura: Lungs are clear. No pleural effusion or pneumothorax. Upper Abdomen: No acute abnormality. Musculoskeletal: Bilateral rim calcified breast implants are seen. Osseous structures are age-appropriate. No acute bone abnormality. Review of the MIP images confirms the above findings. IMPRESSION: 1. No pulmonary embolism. No acute intrathoracic pathology identified. 2. Morphologic changes in keeping with pulmonary arterial hypertension. 3. Mild global cardiomegaly. Mild coronary artery calcification. Aortic Atherosclerosis (ICD10-I70.0). Electronically Signed   By: Helyn Numbers M.D.   On: 06/12/2022 21:25   DG Chest 2 View  Result Date: 06/12/2022 CLINICAL DATA:  Chest pain located under the left breast. [place] of breath. EXAM: CHEST - 2 VIEW COMPARISON:  None Available. FINDINGS: Cardiac silhouette mildly enlarged. No mediastinal or hilar masses. No evidence of adenopathy. Lungs are clear.  No pleural effusion or pneumothorax. Skeletal structures are intact. IMPRESSION: No acute cardiopulmonary disease. Electronically Signed   By: Amie Portland M.D.   On: 06/12/2022 17:15    EKG: My personal interpretation of EKG shows: NSR    Assessment/Plan Principal Problem:   Hypertensive urgency Active Problems:   Stage 3b chronic kidney disease (CKD) ([facility])   HTN (hypertension)   Hyperlipidemia    Assessment and Plan: * Hypertensive urgency Admit to observation med/tele bed. Pt with clear lung exam. No peripheral edema. Does not have physical signs of volume overload. Will check echo. Need to get better blood control. Pt does admit to missing her medications(atenolol and benazepril) yesterday and taking ibuprofen and ASA for the last 3 days. Add prn IV hydralazine to her home meds. CTPA suggests pulmonary HTN. Check echo.  Hyperlipidemia crestor  5 mg daily. Pt unclear on dose of crestor.  HTN (hypertension) Continue atenolol and benazepril.  Stage 3b chronic kidney disease (CKD) ([facility]) No baseline Scr in our system. Pt does states she has chronic kidney disease.   DVT prophylaxis: SQ Heparin Code Status: Full Code Family Communication: no family at bedside  Disposition Plan: return home  Consults called: none  Admission status: Observation, Telemetry bed   Carollee Herter, DO [facility] 06/12/2022, 11:40 PM

## 2022-06-12 NOTE — ED Provider Notes (Signed)
[facility] EMERGENCY DEPARTMENT Provider Note   CSN: [id number] Arrival date & time: 06/12/22  1542     History  Chief Complaint  Patient presents with   Chest Pain    Deborah Logan is a 76 y.o. female.  76 year old female with history of hypertension, hyperlipidemia, diabetes and CKD who presents to the emergency department with chest pain and shortness of breath.  Since last Wednesday the patient reports that she has had left-sided dull pain that is worse with exertion.  Denies any diaphoresis or vomiting.  No radiation of the pain.  Around a week ago did have an incident where she had to stretch to reach something and had some left-sided chest discomfort with that but says that this pain is not worse with moving her arms.  Also has had mild shortness of breath and dizziness.  No leg swelling, history of DVT or PE, recent surgery, cancer history, or hormone use.  Denies any fevers or cough recently.  Father had an MI at 21.       Home Medications Prior to Admission medications   Not on File      Allergies    Patient has no known allergies.    Review of Systems   Review of Systems  Physical Exam Updated Vital Signs BP (!) 218/71 (BP Location: Right Arm)   Pulse 70   Temp 98.7 F (37.1 C) (Oral)   Resp 16   SpO2 99%  Physical Exam Vitals and nursing note reviewed.  Constitutional:      General: She is not in acute distress.    Appearance: She is well-developed.  HENT:     Head: Normocephalic and atraumatic.     Right Ear: External ear normal.     Left Ear: External ear normal.     Nose: Nose normal.  Eyes:     Extraocular Movements: Extraocular movements intact.     Conjunctiva/sclera: Conjunctivae normal.     Pupils: Pupils are equal, round, and reactive to light.  Cardiovascular:     Rate and Rhythm: Regular rhythm. Bradycardia present.     Heart sounds: No murmur heard.    Comments: In bigeminy on the monitor Pulmonary:     Effort:  Pulmonary effort is normal. No respiratory distress.     Breath sounds: Normal breath sounds.  Abdominal:     General: Abdomen is flat. There is no distension.     Palpations: Abdomen is soft. There is no mass.     Tenderness: There is no abdominal tenderness. There is no guarding.  Musculoskeletal:        General: No swelling.     Cervical back: Normal range of motion and neck supple.     Right lower leg: No edema.     Left lower leg: No edema.  Skin:    General: Skin is warm and dry.     Capillary Refill: Capillary refill takes less than 2 seconds.  Neurological:     Mental Status: She is alert and oriented to person, place, and time. Mental status is at baseline.  Psychiatric:        Mood and Affect: Mood normal.     ED Results / Procedures / Treatments   Labs (all labs ordered are listed, but only abnormal results are displayed) Labs Reviewed  CBC WITH DIFFERENTIAL/PLATELET  BASIC METABOLIC PANEL  TROPONIN I (HIGH SENSITIVITY)    EKG None  Radiology No results found.  Procedures Procedures  Medications Ordered in ED Medications - No data to display  ED Course/ Medical Decision Making/ A&P                           Medical Decision Making Amount and/or Complexity of Data Reviewed Labs: ordered.  Risk OTC drugs. Prescription drug management.   Deborah Logan is a 76 y.o. female with comorbidities that complicate the patient evaluation including hypertension, hyperlipidemia, CKD, and diabetes who presents with chief complaint of chest discomfort.  This patient presents to the ED for concern of complaints listed in HPI, this involves an extensive number of treatment options, and is a complaint that carries with it a high risk of complications and morbidity. Disposition including potential need for admission considered.   Initial Ddx:  MI, PE, pneumonia, URI, MSK strain  MDM:  Concern the patient may be having MI with her exertional chest discomfort and  risk factors.  PE also on the differential given her age.  No infectious symptoms that would suggest URI or pneumonia.  With her history of stretching and feeling chest discomfort MSK causes also on the differential though feel that this is a diagnosis of exclusion at this time.  Blood pressure has improved from [id number] systolic while I was in the room so will monitor but do not feel that she needs emergent antihypertensives at this time.  Plan:  Labs Troponin D-dimer EKG Chest x-ray Aspirin Nitroglycerin  ED Summary/Re-evaluation:  ***  Dispo: {Disposition:28069}   Additional history obtained from {Additional History:28067} Records reviewed {Records Reviewed:28068} The following labs were independently interpreted: {labs interpreted:28064} and show {lab findings:28250} I independently reviewed the following imaging with scope of interpretation limited to determining acute life threatening conditions related to emergency care: {imaging interpreted:28065}, which revealed {No acute abnormality:28066}  I personally reviewed and interpreted cardiac monitoring: {cardiac monitoring:28251} I personally reviewed and interpreted the pt's EKG: see above for interpretation  I have reviewed the patients home medications and made adjustments as needed Consults: {Consultants:28063} Social Determinants of health:  ***  {Document critical care time when appropriate:1} {Document POCUS if Performed:1}   {Document critical care time when appropriate:1} {Document review of labs and clinical decision tools ie heart score, Chads2Vasc2 etc:1}  {Document your independent review of radiology images, and any outside records:1} {Document your discussion with family members, caretakers, and with consultants:1} {Document social determinants of health affecting pt's care:1} {Document your decision making why or why not admission, treatments were needed:1} Final Clinical Impression(s) / ED  Diagnoses Final diagnoses:  None    Rx / DC Orders ED Discharge Orders     None

## 2022-06-12 NOTE — Assessment & Plan Note (Signed)
crestor 5 mg daily. Pt unclear on dose of crestor.

## 2022-06-12 NOTE — Subjective & Objective (Signed)
CC: chest discomfort HPI: Seven 76-year-old Caucasian female history of hypertension, hyperlipidemia, CKD stage IIIb presents to the ER today with continued chest pain for the last 3 days.  Patient states last week she was painting her porch.  She thinks she may have overworked her muscles.  She has been having left-sided chest discomfort but no pain for the last 3 days.  She has been taking ibuprofen and aspirin.  She missed a dose of her atenolol and benazepril she takes for hypertension yesterday.  She states that due to continued unrelenting chest discomfort, she presented to the urgent care for evaluation.  She was sent to the ER for evaluation.  Patient denies any shortness of breath.  No nausea.  No vomiting.  No diaphoresis.  No radiation of her chest discomfort to her arms, neck, jaw.  She has no prior history of coronary disease.  On arrival to the ER, temp 98.7 heart rate 70 blood pressure 218/71.  9 9% on room air.  Labs showed a BNP of 928  Magnesium 1.9  D-dimer slightly elevated 0.93  BUN of 25, creatinine 1.27  White count 13.5, hemoglobin 13.9, platelets of 383  CTPA was negative for PE.  There were central pulmonary artery enlargement consistent with pulmonary artery hypertension.  There is cardiomegaly.  EKG shows normal sinus rhythm with occasional bigeminy.

## 2022-06-12 NOTE — Assessment & Plan Note (Signed)
No baseline Scr in our system. Pt does states she has chronic kidney disease.

## 2022-06-12 NOTE — ED Notes (Signed)
Pt transported to CT ?

## 2022-06-12 NOTE — ED Triage Notes (Signed)
Pt reports deep, left sided chest pressure that started three days ago associated with exertional dyspnea. Denies cardiac hx. Pt in bigeminy in triage.

## 2022-06-13 ENCOUNTER — Observation Stay (HOSPITAL_BASED_OUTPATIENT_CLINIC_OR_DEPARTMENT_OTHER): Payer: Medicare Other

## 2022-06-13 DIAGNOSIS — R079 Chest pain, unspecified: Secondary | ICD-10-CM | POA: Diagnosis not present

## 2022-06-13 DIAGNOSIS — I160 Hypertensive urgency: Secondary | ICD-10-CM | POA: Diagnosis not present

## 2022-06-13 DIAGNOSIS — I5030 Unspecified diastolic (congestive) heart failure: Secondary | ICD-10-CM | POA: Diagnosis not present

## 2022-06-13 DIAGNOSIS — N1832 Chronic kidney disease, stage 3b: Secondary | ICD-10-CM | POA: Diagnosis not present

## 2022-06-13 DIAGNOSIS — E785 Hyperlipidemia, unspecified: Secondary | ICD-10-CM | POA: Diagnosis not present

## 2022-06-13 LAB — COMPREHENSIVE METABOLIC PANEL
ALT: 16 U/L (ref 0–44)
AST: 18 U/L (ref 15–41)
Albumin: 3.9 g/dL (ref 3.5–5.0)
Alkaline Phosphatase: 77 U/L (ref 38–126)
Anion gap: 10 (ref 5–15)
BUN: 26 mg/dL — ABNORMAL HIGH (ref 8–23)
CO2: 20 mmol/L — ABNORMAL LOW (ref 22–32)
Calcium: 9 mg/dL (ref 8.9–10.3)
Chloride: 108 mmol/L (ref 98–111)
Creatinine, Ser: 1.25 mg/dL — ABNORMAL HIGH (ref 0.44–1.00)
GFR, Estimated: 44 mL/min — ABNORMAL LOW (ref >60)
Glucose, Bld: 156 mg/dL — ABNORMAL HIGH (ref 70–99)
Potassium: 3.8 mmol/L (ref 3.5–5.1)
Sodium: 138 mmol/L (ref 135–145)
Total Bilirubin: 0.4 mg/dL (ref 0.3–1.2)
Total Protein: 6.7 g/dL (ref 6.5–8.1)

## 2022-06-13 LAB — ECHOCARDIOGRAM COMPLETE
Area-P 1/2: 4.31 cm2
S' Lateral: 3.5 cm

## 2022-06-13 LAB — LIPID PANEL
Cholesterol: 165 mg/dL (ref 0–200)
HDL: 55 mg/dL (ref >40)
LDL Cholesterol: 82 mg/dL (ref 0–99)
Total CHOL/HDL Ratio: 3 RATIO
Triglycerides: 141 mg/dL (ref <150)
VLDL: 28 mg/dL (ref 0–40)

## 2022-06-13 LAB — TROPONIN I (HIGH SENSITIVITY): Troponin I (High Sensitivity): 16 ng/L (ref <18)

## 2022-06-13 LAB — MAGNESIUM: Magnesium: 1.7 mg/dL (ref 1.7–2.4)

## 2022-06-13 LAB — HEMOGLOBIN A1C
Hgb A1c MFr Bld: 6.1 % — ABNORMAL HIGH (ref 4.8–5.6)
Mean Plasma Glucose: 128.37 mg/dL

## 2022-06-13 MED ORDER — IRBESARTAN 300 MG PO TABS
300.0000 mg | ORAL_TABLET | Freq: Every day | ORAL | Status: DC
  Administered 2022-06-13 – 2022-06-14 (×2): 300 mg via ORAL
  Filled 2022-06-13 (×2): qty 1

## 2022-06-13 MED ORDER — HYDROXYZINE HCL 25 MG PO TABS
25.0000 mg | ORAL_TABLET | Freq: Three times a day (TID) | ORAL | Status: DC | PRN
  Administered 2022-06-13: 25 mg via ORAL
  Filled 2022-06-13: qty 1

## 2022-06-13 MED ORDER — ACETAMINOPHEN 325 MG PO TABS
650.0000 mg | ORAL_TABLET | Freq: Four times a day (QID) | ORAL | Status: DC | PRN
  Administered 2022-06-13 – 2022-06-14 (×3): 650 mg via ORAL
  Filled 2022-06-13 (×4): qty 2

## 2022-06-13 MED ORDER — ACETAMINOPHEN 650 MG RE SUPP: 650.0000 mg | Freq: Four times a day (QID) | RECTAL | Status: DC | PRN

## 2022-06-13 MED ORDER — ALLOPURINOL 100 MG PO TABS
100.0000 mg | ORAL_TABLET | Freq: Every day | ORAL | Status: DC
  Administered 2022-06-13 – 2022-06-14 (×2): 100 mg via ORAL
  Filled 2022-06-13 (×2): qty 1

## 2022-06-13 MED ORDER — ONDANSETRON HCL 4 MG PO TABS: 4.0000 mg | ORAL_TABLET | Freq: Four times a day (QID) | ORAL | Status: DC | PRN

## 2022-06-13 MED ORDER — HEPARIN SODIUM (PORCINE) 5000 UNIT/ML IJ SOLN
5000.0000 [IU] | Freq: Three times a day (TID) | INTRAMUSCULAR | Status: DC
  Administered 2022-06-13 – 2022-06-14 (×4): 5000 [IU] via SUBCUTANEOUS
  Filled 2022-06-13 (×4): qty 1

## 2022-06-13 MED ORDER — ROSUVASTATIN CALCIUM 5 MG PO TABS
5.0000 mg | ORAL_TABLET | Freq: Every day | ORAL | Status: DC
  Administered 2022-06-13 – 2022-06-14 (×2): 5 mg via ORAL
  Filled 2022-06-13 (×2): qty 1

## 2022-06-13 MED ORDER — BENAZEPRIL HCL 20 MG PO TABS
40.0000 mg | ORAL_TABLET | Freq: Every day | ORAL | Status: DC
  Administered 2022-06-13: 40 mg via ORAL
  Filled 2022-06-13: qty 2

## 2022-06-13 MED ORDER — MAGNESIUM SULFATE 4 GM/100ML IV SOLN
4.0000 g | Freq: Once | INTRAVENOUS | Status: AC
  Administered 2022-06-13: 4 g via INTRAVENOUS
  Filled 2022-06-13: qty 100

## 2022-06-13 MED ORDER — MELATONIN 5 MG PO TABS
10.0000 mg | ORAL_TABLET | Freq: Every evening | ORAL | Status: DC | PRN
  Filled 2022-06-13: qty 2

## 2022-06-13 MED ORDER — ATENOLOL 25 MG PO TABS
50.0000 mg | ORAL_TABLET | Freq: Every day | ORAL | Status: DC
  Administered 2022-06-13: 50 mg via ORAL
  Filled 2022-06-13: qty 2

## 2022-06-13 MED ORDER — ONDANSETRON HCL 4 MG/2ML IJ SOLN: 4.0000 mg | Freq: Four times a day (QID) | INTRAMUSCULAR | Status: DC | PRN

## 2022-06-13 MED ORDER — POTASSIUM CHLORIDE CRYS ER 20 MEQ PO TBCR
40.0000 meq | EXTENDED_RELEASE_TABLET | Freq: Once | ORAL | Status: AC
  Administered 2022-06-13: 40 meq via ORAL
  Filled 2022-06-13: qty 2

## 2022-06-13 MED ORDER — HYDRALAZINE HCL 20 MG/ML IJ SOLN
10.0000 mg | INTRAMUSCULAR | Status: DC | PRN
  Administered 2022-06-13 (×3): 10 mg via INTRAVENOUS
  Filled 2022-06-13 (×3): qty 1

## 2022-06-13 NOTE — Progress Notes (Signed)
PROGRESS NOTE    Deborah Logan  [id number] DOB: [birth year] DOA: 06/12/2022 PCP: Pcp, No    Brief Narrative:  76 y/o female admitted to the hospital with elevated blood pressure and chest discomfort. Reports chest discomfort in left chest, occurring on exertion, relieved with rest for last 5 days. No prior cardiac testing. BNP elevated, echo pending. Cardiology consult.    Assessment & Plan:   Principal Problem:   Hypertensive urgency Active Problems:   Stage 3b chronic kidney disease (CKD) (HCC)   HTN (hypertension)   Hyperlipidemia   Chest discomfort -described as deep, aching left sided chest pain that occurs on exertion and relieves with rest -no associated shortness of breath, nausea, diaphoresis -EKG shows bigeminy, no acute ST-T changes -Trop peak at 21 -echo pending -no prior cardiac work up in the past -will request cardiology evaluation to see if she would benefit from stress testing prior to discharge  Elevated BNP -clinically, does not appear to be particularly volume overloaded -echo pending  Hypertension, uncontrolled -restart on home meds including atenolol and benazepril -will supplement with hydralazine as needed  Probable CKD stage 3b -no prior labs available for comparison  -creatinine 1.2 -continue to monitor  HLD -continue on crestor -LDL 82  Gout -no evidence of acute gout -continue on allopurinol   DVT prophylaxis: heparin injection 5,000 Units Start: 06/13/22 0100 SCDs Start: 06/13/22 0045  Code Status: full code Family Communication: no family present Disposition Plan: Status is: Observation The patient remains OBS appropriate and will d/c before 2 midnights.     Consultants:    Procedures:    Antimicrobials:      Subjective: Reports having left sided non radiating pain which occurs on exertion, relieves with rest, not reproducible on palpation for last 5 days  Objective: Vitals:   06/13/22 0851 06/13/22 1000  06/13/22 1045 06/13/22 1100  BP:  (!) 189/82    Pulse:  64 95 69  Resp:  18 20 17   Temp: 98.6 F (37 C)     TempSrc:      SpO2:  95% 97% 96%   No intake or output data in the 24 hours ending 06/13/22 1130 There were no vitals filed for this visit.  Examination:  General exam: Appears calm and comfortable  Respiratory system: Clear to auscultation. Respiratory effort normal. Cardiovascular system: S1 & S2 heard, RRR. No JVD, murmurs, rubs, gallops or clicks. No pedal edema. Gastrointestinal system: Abdomen is nondistended, soft and nontender. No organomegaly or masses felt. Normal bowel sounds heard. Central nervous system: Alert and oriented. No focal neurological deficits. Extremities: Symmetric 5 x 5 power. Skin: No rashes, lesions or ulcers Psychiatry: Judgement and insight appear normal. Mood & affect appropriate.     Data Reviewed: I have personally reviewed following labs and imaging studies  CBC: Recent Labs  Lab 06/12/22 1600  WBC 13.5*  NEUTROABS 10.8*  HGB 13.9  HCT 42.4  MCV 92.4  PLT [id number]   Basic Metabolic Panel: Recent Labs  Lab 06/12/22 1600 06/12/22 1808 06/13/22 0452  NA 139  --  138  K 4.4  --  3.8  CL 106  --  108  CO2 21*  --  20*  GLUCOSE 166*  --  156*  BUN 25*  --  26*  CREATININE 1.27*  --  1.25*  CALCIUM 9.7  --  9.0  MG  --  1.9 1.7   GFR: CrCl cannot be calculated (Unknown ideal weight.). Liver Function Tests: Recent Labs  Lab 06/13/22 0452  AST 18  ALT 16  ALKPHOS 77  BILITOT 0.4  PROT 6.7  ALBUMIN 3.9   No results for input(s): "LIPASE", "AMYLASE" in the last 168 hours. No results for input(s): "AMMONIA" in the last 168 hours. Coagulation Profile: No results for input(s): "INR", "PROTIME" in the last 168 hours. Cardiac Enzymes: No results for input(s): "CKTOTAL", "CKMB", "CKMBINDEX", "TROPONINI" in the last 168 hours. BNP (last 3 results) No results for input(s): "PROBNP" in the last 8760 hours. HbA1C: Recent Labs     06/13/22 0010  HGBA1C 6.1*   CBG: No results for input(s): "GLUCAP" in the last 168 hours. Lipid Profile: Recent Labs    06/13/22 0010  CHOL 165  HDL 55  LDLCALC 82  TRIG 141  CHOLHDL 3.0   Thyroid Function Tests: No results for input(s): "TSH", "T4TOTAL", "FREET4", "T3FREE", "THYROIDAB" in the last 72 hours. Anemia Panel: No results for input(s): "VITAMINB12", "FOLATE", "FERRITIN", "TIBC", "IRON", "RETICCTPCT" in the last 72 hours. Sepsis Labs: No results for input(s): "PROCALCITON", "LATICACIDVEN" in the last 168 hours.  No results found for this or any previous visit (from the past 240 hour(s)).       Radiology Studies: CT Angio Chest PE W and/or Wo Contrast  Result Date: 06/12/2022 CLINICAL DATA:  Pulmonary embolism (PE) suspected, positive D-dimer. Chest pain. EXAM: CT ANGIOGRAPHY CHEST WITH CONTRAST TECHNIQUE: Multidetector CT imaging of the chest was performed using the standard protocol during bolus administration of intravenous contrast. Multiplanar CT image reconstructions and MIPs were obtained to evaluate the vascular anatomy. RADIATION DOSE REDUCTION: This exam was performed according to the departmental dose-optimization program which includes automated exposure control, adjustment of the mA and/or kV according to patient size and/or use of iterative reconstruction technique. CONTRAST:  40mL OMNIPAQUE IOHEXOL 350 MG/ML SOLN COMPARISON:  None Available. FINDINGS: Cardiovascular: There is adequate opacification of the pulmonary arterial tree. No intraluminal filling defect identified to suggest acute pulmonary embolism. Central pulmonary arteries are enlarged in keeping with changes of pulmonary arterial hypertension. Mild global cardiomegaly. Mild coronary artery calcification. No pericardial effusion. Mild atherosclerotic calcification within the thoracic aorta. No aortic aneurysm. Mediastinum/Nodes: No enlarged mediastinal, hilar, or axillary lymph nodes. Thyroid  gland, trachea, and esophagus demonstrate no significant findings. Lungs/Pleura: Lungs are clear. No pleural effusion or pneumothorax. Upper Abdomen: No acute abnormality. Musculoskeletal: Bilateral rim calcified breast implants are seen. Osseous structures are age-appropriate. No acute bone abnormality. Review of the MIP images confirms the above findings. IMPRESSION: 1. No pulmonary embolism. No acute intrathoracic pathology identified. 2. Morphologic changes in keeping with pulmonary arterial hypertension. 3. Mild global cardiomegaly. Mild coronary artery calcification. Aortic Atherosclerosis (ICD10-I70.0). Electronically Signed   By: Helyn Numbers M.D.   On: 06/12/2022 21:25   DG Chest 2 View  Result Date: 06/12/2022 CLINICAL DATA:  Chest pain located under the left breast. [place] of breath. EXAM: CHEST - 2 VIEW COMPARISON:  None Available. FINDINGS: Cardiac silhouette mildly enlarged. No mediastinal or hilar masses. No evidence of adenopathy. Lungs are clear.  No pleural effusion or pneumothorax. Skeletal structures are intact. IMPRESSION: No acute cardiopulmonary disease. Electronically Signed   By: Amie Portland M.D.   On: 06/12/2022 17:15        Scheduled Meds:  allopurinol  100 mg Oral Daily   atenolol  50 mg Oral Daily   benazepril  40 mg Oral Daily   heparin  5,000 Units Subcutaneous Q8H   rosuvastatin  5 mg Oral Daily   Continuous Infusions:  LOS: 0 days    Time spent: 38mins    Kathie Dike, MD [facility]   If 7PM-7AM, please contact night-coverage [email or web address]  06/13/2022, 11:30 AM

## 2022-06-13 NOTE — Consult Note (Addendum)
Cardiology Consultation   Patient ID: Deborah Logan MRN: [id number]; DOB: May 10, 1946  Admit date: 06/12/2022 Date of Consult: 06/13/2022  PCP:  Aviva Kluver   [facility] Providers Cardiologist:  New to [facility]    Patient Profile:   Deborah Logan is a 76 y.o. female with a hx of HTN, HLD, gout, obesity and CKD stage IIIb who is being seen 06/13/2022 for the evaluation of chest pain at the request of Dr. Kerry Hough.  History of Present Illness:   [name] has no cardiac history and has not followed with a cardiologist.  She has a history of hypertension, hyperlipidemia and prediabetes which is followed by her PCP.   She presented to an urgent care on 06/12/2022 for evaluation of chest discomfort and was sent to the emergency department where she was found to have hypertensive urgency in the setting of missing her home antihypertensive medications.  She was restarted on her home atenolol and benazepril and started on PRN hydralazine.  ECG showed no acute ischemic changes but was remarkable for ventricular bigeminy and mild first-degree AV block. D-dimer was mildly elevated but CT angio was negative for acute PE.  CT showed morphologic changes consistent with pulmonary arterial hypertension as well as mild global cardiomegaly, aortic atherosclerosis and mild coronary artery calcification.  Chest x-ray negative.  BNP was elevated at 928.8. HS trop 18-->21-->16. Hemoglobin A1c 6.1, LDL 82, creatinine 1.25 with a GFR 44, magnesium 1.7, potassium 3.8, WBC 13.5, hemoglobin 13.9, platelets 383. Echo ordered and pending. Cardiology consulted for further work up.   She is currently alone in the ER. No chest pain currently. Feels like she is hyperaware of her chest currently. BP has become elevated again. Dinging on telemetry has caused her stress and it was turned.  I cannot see the note in care everywhere but the patient reports her EKG showed a heart rate in the 30s at urgent care yesterday.  She  denies any dizziness or syncope.  She denies lower extremity edema, orthopnea or PND.  She does say that she is more aware of her breathing when laying flat but does not have to prop up on pillows.  She has possibly had worsening exercise tolerance recently, but her mobility is limited by knee osteoarthritis and back pain.  Her chest pain is described as a dull aching in her left-sided chest that was worse with exertion and better at rest.  No radiation.  She has a family history of CAD in her father who passed away from a heart attack in his 64s.   Past Medical History:  Diagnosis Date   HLD (hyperlipidemia)    HTN (hypertension) 06/12/2022   Hyperlipidemia 06/12/2022   Hypertension    Stage 3b chronic kidney disease (CKD) (HCC) 06/12/2022    Past Surgical History:  Procedure Laterality Date   PLACEMENT OF BREAST IMPLANTS Bilateral      Home Medications:  Prior to Admission medications   Medication Sig Start Date End Date Taking? Authorizing Provider  allopurinol (ZYLOPRIM) 100 MG tablet Take 100 mg by mouth daily.   Yes [provider]  atenolol (TENORMIN) 50 MG tablet Take 50 mg by mouth daily.   Yes [provider]  benazepril (LOTENSIN) 40 MG tablet Take 40 mg by mouth daily.   Yes [provider]  rosuvastatin (CRESTOR) 5 MG tablet Take 5 mg by mouth daily. 04/19/22  Yes [provider]  vitamin D3 (CHOLECALCIFEROL) 25 MCG tablet Take 1,000 Units by  mouth daily.   Yes [provider]    Inpatient Medications: Scheduled Meds:  allopurinol  100 mg Oral Daily   atenolol  50 mg Oral Daily   benazepril  40 mg Oral Daily   heparin  5,000 Units Subcutaneous Q8H   rosuvastatin  5 mg Oral Daily   Continuous Infusions:  magnesium sulfate bolus IVPB 4 g (06/13/22 1455)   PRN Meds: acetaminophen **OR** acetaminophen, hydrALAZINE, hydrOXYzine, melatonin, nitroGLYCERIN, ondansetron **OR** ondansetron (ZOFRAN) IV  Allergies:   No Known  Allergies  Social History:   Social History   Socioeconomic History   Marital status: Married    Spouse name: Not on file   Number of children: Not on file   Years of education: Not on file   Highest education level: Not on file  Occupational History   Not on file  Tobacco Use   Smoking status: Former    Types: Cigarettes   Smokeless tobacco: Never  Substance and Sexual Activity   Alcohol use: Yes    Comment: rarely   Drug use: Never   Sexual activity: Not on file  Other Topics Concern   Not on file  Social History Narrative   Not on file   Social Determinants of Health   Financial Resource Strain: Not on file  Food Insecurity: Not on file  Transportation Needs: Not on file  Physical Activity: Not on file  Stress: Not on file  Social Connections: Not on file  Intimate Partner Violence: Not on file    Family History:   No family history on file.   ROS:  Please see the history of present illness.   All other ROS reviewed and negative.     Physical Exam/Data:   Vitals:   06/13/22 1045 06/13/22 1100 06/13/22 1250 06/13/22 1535  BP:   (!) 176/60 (!) 166/70  Pulse: 95 69 (!) 56 69  Resp: 20 17 15 14   Temp:    97.8 F (36.6 C)  TempSrc:    Oral  SpO2: 97% 96% 96% 97%   No intake or output data in the 24 hours ending 06/13/22 1544     No data to display           There is no height or weight on file to calculate BMI.  General:  Well nourished, well developed, in no acute distress, overweight HEENT: normal Neck: no JVD Vascular: No carotid bruits; Distal pulses 2+ bilaterally Cardiac:  normal S1, S2; RRR; no murmur  Lungs:  clear to auscultation bilaterally, no wheezing, rhonchi or rales  Abd: soft, nontender, no hepatomegaly  Ext: no edema Musculoskeletal:  No deformities, BUE and BLE strength normal and equal Skin: warm and dry  Neuro:  CNs 2-12 intact, no focal abnormalities noted Psych:  Normal affect   EKG:  The EKG was personally reviewed and  demonstrates: Sinus with first-degree AV block and bigeminy. Telemetry:  Telemetry was personally reviewed and demonstrates: Sinus with frequent ventricular ectopy and bigeminy  Relevant CV Studies: Pending  Laboratory Data:  High Sensitivity Troponin:   Recent Labs  Lab 06/12/22 1600 06/12/22 1808 06/12/22 2213 06/13/22 0010  TROPONINIHS 14 18* 21* 16     Chemistry Recent Labs  Lab 06/12/22 1600 06/12/22 1808 06/13/22 0452  NA 139  --  138  K 4.4  --  3.8  CL 106  --  108  CO2 21*  --  20*  GLUCOSE 166*  --  156*  BUN 25*  --  26*  CREATININE 1.27*  --  1.25*  CALCIUM 9.7  --  9.0  MG  --  1.9 1.7  GFRNONAA 44*  --  44*  ANIONGAP 12  --  10    Recent Labs  Lab 06/13/22 0452  PROT 6.7  ALBUMIN 3.9  AST 18  ALT 16  ALKPHOS 77  BILITOT 0.4   Lipids  Recent Labs  Lab 06/13/22 0010  CHOL 165  TRIG 141  HDL 55  LDLCALC 82  CHOLHDL 3.0    Hematology Recent Labs  Lab 06/12/22 1600  WBC 13.5*  RBC 4.59  HGB 13.9  HCT 42.4  MCV 92.4  MCH 30.3  MCHC 32.8  RDW 13.2  PLT 383   Thyroid No results for input(s): "TSH", "FREET4" in the last 168 hours.  BNP Recent Labs  Lab 06/12/22 1600  BNP 928.8*    DDimer  Recent Labs  Lab 06/12/22 1808  DDIMER 0.93*     Radiology/Studies:  CT Angio Chest PE W and/or Wo Contrast  Result Date: 06/12/2022 CLINICAL DATA:  Pulmonary embolism (PE) suspected, positive D-dimer. Chest pain. EXAM: CT ANGIOGRAPHY CHEST WITH CONTRAST TECHNIQUE: Multidetector CT imaging of the chest was performed using the standard protocol during bolus administration of intravenous contrast. Multiplanar CT image reconstructions and MIPs were obtained to evaluate the vascular anatomy. RADIATION DOSE REDUCTION: This exam was performed according to the departmental dose-optimization program which includes automated exposure control, adjustment of the mA and/or kV according to patient size and/or use of iterative reconstruction technique.  CONTRAST:  27mL OMNIPAQUE IOHEXOL 350 MG/ML SOLN COMPARISON:  None Available. FINDINGS: Cardiovascular: There is adequate opacification of the pulmonary arterial tree. No intraluminal filling defect identified to suggest acute pulmonary embolism. Central pulmonary arteries are enlarged in keeping with changes of pulmonary arterial hypertension. Mild global cardiomegaly. Mild coronary artery calcification. No pericardial effusion. Mild atherosclerotic calcification within the thoracic aorta. No aortic aneurysm. Mediastinum/Nodes: No enlarged mediastinal, hilar, or axillary lymph nodes. Thyroid gland, trachea, and esophagus demonstrate no significant findings. Lungs/Pleura: Lungs are clear. No pleural effusion or pneumothorax. Upper Abdomen: No acute abnormality. Musculoskeletal: Bilateral rim calcified breast implants are seen. Osseous structures are age-appropriate. No acute bone abnormality. Review of the MIP images confirms the above findings. IMPRESSION: 1. No pulmonary embolism. No acute intrathoracic pathology identified. 2. Morphologic changes in keeping with pulmonary arterial hypertension. 3. Mild global cardiomegaly. Mild coronary artery calcification. Aortic Atherosclerosis (ICD10-I70.0). Electronically Signed   By: Fidela Salisbury M.D.   On: 06/12/2022 21:25   DG Chest 2 View  Result Date: 06/12/2022 CLINICAL DATA:  Chest pain located under the left breast. [place] of breath. EXAM: CHEST - 2 VIEW COMPARISON:  None Available. FINDINGS: Cardiac silhouette mildly enlarged. No mediastinal or hilar masses. No evidence of adenopathy. Lungs are clear.  No pleural effusion or pneumothorax. Skeletal structures are intact. IMPRESSION: No acute cardiopulmonary disease. Electronically Signed   By: Lajean Manes M.D.   On: 06/12/2022 17:15     Assessment and Plan:   Chest pain: ECG with no acute changes. HS trop 18-->21-->16.  She does has risk factors for CAD including obesity, hyperlipidemia, hypertension,  pre diabetes and CT showed aortic atherosclerosis & coronary calcifications. She will need an ischemic w/u. Would favor waiting on echo. If her EF is down, she will need a L/RHC. If EF normal, could consider alternative stress testing.    Hypertensive urgency: presented with chest pain and elevated BNP in the setting of elevated blood  pressure.  This has improved on home meds as well as as needed hydralazine. Will make make medication changes after results of echo returns. If she has reduced EF, we would treat her hypertension in the context of CHF.   PVCs: continue atenolol.   HLD: continue statin  CKD stage IIIb: creatinine 1.25 with a GFR 44, continue to monitor.   Risk Assessment/Risk Scores:           For questions or updates, please contact [place] Please consult [email or web address] for contact info under    Signed, Angelena Form, PA-C  06/13/2022 3:44 PM    Attending Note:   The patient was seen and examined.  Agree with assessment and plan as noted above.  Changes made to the above note as needed.  Patient seen and independently examined with Nell Range, PA .   We discussed all aspects of the encounter. I agree with the assessment and plan as stated above.    Chest pain:   Drucie  presents with episodes of chest discomfort.  Described as a cramping like sensation in her chest.  She reports that she did.  Advised her to present.  Chest wall tenderness.  She has associated elevations of troponin levels.  No significant sore.  We will get an echocardiogram today.  She is significantly dysfunctional and she will likely need a heart catheterization.  If her LV function is well-preserved the point that we should do a coronary CT angiogram.   2.  Hypertension: Blood pressure is elevated.  She is on benazepril and atenolol.   My preference would be to transition her to  valsartan.  Valsartan was unavailable in the hospital.  Will give her Irbesartan 300 mg  tonight  We should consider Entresto to start on Tuesday AM ( to allow for ACE-I washout )  if the echo reveals systolic or diastolic CHF   We discussed salt restriction / weight loss / regular exercise      I have spent a total of 40 minutes with patient reviewing hospital  notes , telemetry, EKGs, labs and examining patient as well as establishing an assessment and plan that was discussed with the patient.  > 50% of time was spent in direct patient care.  I will plan on seeing her in the office as OP in several weeks.     Thayer Headings, Brooke Bonito., MD, [employer] 06/13/2022, 4:23 PM [address]  [place] Pager [phone]

## 2022-06-14 ENCOUNTER — Other Ambulatory Visit: Payer: Self-pay | Admitting: Cardiology

## 2022-06-14 DIAGNOSIS — R7989 Other specified abnormal findings of blood chemistry: Secondary | ICD-10-CM

## 2022-06-14 DIAGNOSIS — R072 Precordial pain: Secondary | ICD-10-CM | POA: Diagnosis not present

## 2022-06-14 DIAGNOSIS — E785 Hyperlipidemia, unspecified: Secondary | ICD-10-CM | POA: Diagnosis not present

## 2022-06-14 DIAGNOSIS — I209 Angina pectoris, unspecified: Secondary | ICD-10-CM

## 2022-06-14 DIAGNOSIS — I160 Hypertensive urgency: Secondary | ICD-10-CM | POA: Diagnosis not present

## 2022-06-14 DIAGNOSIS — N1832 Chronic kidney disease, stage 3b: Secondary | ICD-10-CM | POA: Diagnosis not present

## 2022-06-14 LAB — BASIC METABOLIC PANEL
Anion gap: 13 (ref 5–15)
BUN: 32 mg/dL — ABNORMAL HIGH (ref 8–23)
CO2: 19 mmol/L — ABNORMAL LOW (ref 22–32)
Calcium: 9.1 mg/dL (ref 8.9–10.3)
Chloride: 103 mmol/L (ref 98–111)
Creatinine, Ser: 1.51 mg/dL — ABNORMAL HIGH (ref 0.44–1.00)
GFR, Estimated: 35 mL/min — ABNORMAL LOW (ref >60)
Glucose, Bld: 126 mg/dL — ABNORMAL HIGH (ref 70–99)
Potassium: 4.2 mmol/L (ref 3.5–5.1)
Sodium: 135 mmol/L (ref 135–145)

## 2022-06-14 LAB — CBC
HCT: 40.7 % (ref 36.0–46.0)
Hemoglobin: 13 g/dL (ref 12.0–15.0)
MCH: 30 pg (ref 26.0–34.0)
MCHC: 31.9 g/dL (ref 30.0–36.0)
MCV: 93.8 fL (ref 80.0–100.0)
Platelets: 355 10*3/uL (ref 150–400)
RBC: 4.34 MIL/uL (ref 3.87–5.11)
RDW: 13.6 % (ref 11.5–15.5)
WBC: 9.7 10*3/uL (ref 4.0–10.5)
nRBC: 0 % (ref 0.0–0.2)

## 2022-06-14 MED ORDER — CARVEDILOL 25 MG PO TABS: 25.0000 mg | ORAL_TABLET | Freq: Two times a day (BID) | ORAL | 1 refills | Status: DC

## 2022-06-14 MED ORDER — AMLODIPINE BESYLATE 5 MG PO TABS: 5.0000 mg | ORAL_TABLET | Freq: Every day | ORAL | 1 refills | Status: DC

## 2022-06-14 MED ORDER — CARVEDILOL 12.5 MG PO TABS: 12.5000 mg | ORAL_TABLET | Freq: Two times a day (BID) | ORAL | Status: DC

## 2022-06-14 MED ORDER — PERFLUTREN LIPID MICROSPHERE
1.0000 mL | INTRAVENOUS | Status: DC | PRN
  Administered 2022-06-13: 2 mL via INTRAVENOUS

## 2022-06-14 MED ORDER — CARVEDILOL 12.5 MG PO TABS
25.0000 mg | ORAL_TABLET | Freq: Two times a day (BID) | ORAL | Status: DC
  Administered 2022-06-14: 25 mg via ORAL
  Filled 2022-06-14: qty 2

## 2022-06-14 MED ORDER — AMLODIPINE BESYLATE 5 MG PO TABS
5.0000 mg | ORAL_TABLET | Freq: Every day | ORAL | Status: DC
  Administered 2022-06-14: 5 mg via ORAL
  Filled 2022-06-14: qty 1

## 2022-06-14 NOTE — Discharge Summary (Signed)
Physician Discharge Summary  Deborah Logan [id number] DOB: 04-14-1946 DOA: 06/12/2022  PCP: Pcp, No  Admit date: 06/12/2022 Discharge date: 06/14/2022  Admitted From: Home Disposition: Home  Recommendations for Outpatient Follow-up:  Follow up with PCP in 1-2 weeks Please obtain BMP/CBC in one week Patient will be scheduled for outpatient stress test and cardiology follow-up   Discharge Condition: Stable CODE STATUS: Full code Diet recommendation: Heart healthy  Brief/Interim Summary: 76 year old female admitted to the hospital with elevated blood pressure and chest discomfort.  Noted to have mild elevation of troponin.  No acute EKG changes of the bigeminy.  BNP noted to be elevated.  Echocardiogram showed preserved ejection fraction.  Seen by cardiology.  Plans are for outpatient stress test.  Discharge Diagnoses:  Principal Problem:   Hypertensive urgency Active Problems:   Stage 3b chronic kidney disease (CKD) (HCC)   HTN (hypertension)   Hyperlipidemia  Chest discomfort -described as deep, aching left sided chest pain that occurs on exertion and relieves with rest -no associated shortness of breath, nausea, diaphoresis -EKG shows bigeminy, no acute ST-T changes -Trop peak at 21 -echo showed normal ejection fraction -no prior cardiac work up in the past -Seen by cardiology who recommended inpatient stress test prior to discharge -Patient requested this be arranged as an outpatient.  Risks of discharging prior to stress test were explained, and patient does accept risk.  If her stress test is positive, she will need cardiac cath.  Cardiology will arrange outpatient stress test and follow-up -Currently she reports that she is not having any further chest discomfort   Elevated BNP -clinically, does not appear to be particularly volume overloaded -echo showed normal ejection fraction   Hypertension, uncontrolled -Home medications changed from atenolol to  Coreg. -Benazepril discontinued due to CKD and elevated creatinine -She was also started on amlodipine   Probable CKD stage 3b -baseline creatinine 1.2 -Currently creatinine 1.5 -continue to monitor as outpatient   HLD -continue on crestor -LDL 82   Gout -no evidence of acute gout -continue on allopurinol    Discharge Instructions  Discharge Instructions     Diet - low sodium heart healthy   Complete by: As directed    Increase activity slowly   Complete by: As directed       Allergies as of 06/14/2022   No Known Allergies      Medication List     STOP taking these medications    atenolol 50 MG tablet Commonly known as: TENORMIN   benazepril 40 MG tablet Commonly known as: LOTENSIN       TAKE these medications    allopurinol 100 MG tablet Commonly known as: ZYLOPRIM Take 100 mg by mouth daily.   amLODipine 5 MG tablet Commonly known as: NORVASC Take 1 tablet (5 mg total) by mouth daily. Start taking on: June 15, 2022   carvedilol 25 MG tablet Commonly known as: COREG Take 1 tablet (25 mg total) by mouth 2 (two) times daily with a meal.   rosuvastatin 5 MG tablet Commonly known as: CRESTOR Take 5 mg by mouth daily.   vitamin D3 25 MCG tablet Commonly known as: CHOLECALCIFEROL Take 1,000 Units by mouth daily.        Follow-up Information     Nahser, Wonda Cheng, MD Follow up.   Specialty: Cardiology Contact information: [address] [place] [address] [phone]                No Known  Allergies  Consultations: Cardiology   Procedures/Studies: ECHOCARDIOGRAM COMPLETE  Result Date: 06/13/2022    ECHOCARDIOGRAM REPORT   Patient Name:   Deborah Logan Date of Exam: 06/13/2022 Medical Rec #:  [id number]    Height: Accession #:    [id number]   Weight: Date of Birth:  03-08-1946    BSA: Patient Age:    28 years     BP:           166/70 mmHg Patient Gender: F            HR:           77 bpm. Exam Location:   Inpatient Procedure: 2D Echo, Cardiac Doppler, Color Doppler and Intracardiac            Opacification Agent Indications:    Pulmonary hypertension  History:        Patient has no prior history of Echocardiogram examinations.                 Risk Factors:Hypertension and Dyslipidemia. CKD.  Sonographer:    Clayton Lefort RDCS (AE) Referring Phys: ERIC CHEN  Sonographer Comments: Technically difficult study due to poor echo windows. Image acquisition challenging due to breast implants. IMPRESSIONS  1. Left ventricular ejection fraction, by estimation, is 50-60%, varying based on frequent PVC's. The left ventricle has normal function. The left ventricle has no regional wall motion abnormalities. There is mild left ventricular hypertrophy. Left ventricular diastolic parameters are consistent with Grade I diastolic dysfunction (impaired relaxation).  2. Right ventricular systolic function is normal. The right ventricular size is normal. Tricuspid regurgitation signal is inadequate for assessing PA pressure.  3. The mitral valve is grossly normal. Trivial mitral valve regurgitation.  4. The aortic valve was not well visualized. Aortic valve regurgitation is not visualized.  5. Aortic dilatation noted. There is borderline dilatation of the ascending aorta, measuring 39 mm.  6. The inferior vena cava is normal in size with greater than 50% respiratory variability, suggesting right atrial pressure of 3 mmHg. Comparison(s): No prior Echocardiogram. FINDINGS  Left Ventricle: Left ventricular ejection fraction, by estimation, is 50-60%. The left ventricle has normal function. The left ventricle has no regional wall motion abnormalities. Definity contrast agent was given IV to delineate the left ventricular endocardial borders. The left ventricular internal cavity size was normal in size. There is mild left ventricular hypertrophy. Left ventricular diastolic parameters are consistent with Grade I diastolic dysfunction (impaired  relaxation). Indeterminate filling pressures. Right Ventricle: The right ventricular size is normal. No increase in right ventricular wall thickness. Right ventricular systolic function is normal. Tricuspid regurgitation signal is inadequate for assessing PA pressure. Left Atrium: Left atrial size was normal in size. Right Atrium: Right atrial size was normal in size. Pericardium: There is no evidence of pericardial effusion. Mitral Valve: The mitral valve is grossly normal. Trivial mitral valve regurgitation. Tricuspid Valve: The tricuspid valve is normal in structure. Tricuspid valve regurgitation is not demonstrated. Aortic Valve: The aortic valve was not well visualized. Aortic valve regurgitation is not visualized. Pulmonic Valve: The pulmonic valve was not well visualized. Pulmonic valve regurgitation is not visualized. Aorta: Aortic dilatation noted. There is borderline dilatation of the ascending aorta, measuring 39 mm. Venous: The inferior vena cava is normal in size with greater than 50% respiratory variability, suggesting right atrial pressure of 3 mmHg. IAS/Shunts: No atrial level shunt detected by color flow Doppler.  LEFT VENTRICLE PLAX 2D LVIDd:  4.80 cm LVIDs:         3.50 cm LV PW:         1.20 cm LV IVS:        1.10 cm LVOT diam:     1.90 cm LV SV:         56 LVOT Area:     2.84 cm  RIGHT VENTRICLE             IVC RV Basal diam:  2.80 cm     IVC diam: 1.80 cm RV S prime:     18.70 cm/s TAPSE (M-mode): 3.2 cm LEFT ATRIUM             RIGHT ATRIUM LA diam:        4.80 cm RA Area:     10.10 cm LA Vol (A2C):   36.8 ml RA Volume:   18.90 ml LA Vol (A4C):   59.9 ml LA Biplane Vol: 50.6 ml  AORTIC VALVE LVOT Vmax:   80.40 cm/s LVOT Vmean:  53.400 cm/s LVOT VTI:    0.197 m  AORTA Ao Root diam: 3.00 cm Ao Asc diam:  3.90 cm MITRAL VALVE MV Area (PHT): 4.31 cm     SHUNTS MV Decel Time: 176 msec     Systemic VTI:  0.20 m MV E velocity: 112.00 cm/s  Systemic Diam: 1.90 cm MV A velocity: 103.00 cm/s MV  E/A ratio:  1.09 Lyman Bishop MD Electronically signed by Lyman Bishop MD Signature Date/Time: 06/13/2022/5:08:26 PM    Final    CT Angio Chest PE W and/or Wo Contrast  Result Date: 06/12/2022 CLINICAL DATA:  Pulmonary embolism (PE) suspected, positive D-dimer. Chest pain. EXAM: CT ANGIOGRAPHY CHEST WITH CONTRAST TECHNIQUE: Multidetector CT imaging of the chest was performed using the standard protocol during bolus administration of intravenous contrast. Multiplanar CT image reconstructions and MIPs were obtained to evaluate the vascular anatomy. RADIATION DOSE REDUCTION: This exam was performed according to the departmental dose-optimization program which includes automated exposure control, adjustment of the mA and/or kV according to patient size and/or use of iterative reconstruction technique. CONTRAST:  82mL OMNIPAQUE IOHEXOL 350 MG/ML SOLN COMPARISON:  None Available. FINDINGS: Cardiovascular: There is adequate opacification of the pulmonary arterial tree. No intraluminal filling defect identified to suggest acute pulmonary embolism. Central pulmonary arteries are enlarged in keeping with changes of pulmonary arterial hypertension. Mild global cardiomegaly. Mild coronary artery calcification. No pericardial effusion. Mild atherosclerotic calcification within the thoracic aorta. No aortic aneurysm. Mediastinum/Nodes: No enlarged mediastinal, hilar, or axillary lymph nodes. Thyroid gland, trachea, and esophagus demonstrate no significant findings. Lungs/Pleura: Lungs are clear. No pleural effusion or pneumothorax. Upper Abdomen: No acute abnormality. Musculoskeletal: Bilateral rim calcified breast implants are seen. Osseous structures are age-appropriate. No acute bone abnormality. Review of the MIP images confirms the above findings. IMPRESSION: 1. No pulmonary embolism. No acute intrathoracic pathology identified. 2. Morphologic changes in keeping with pulmonary arterial hypertension. 3. Mild global  cardiomegaly. Mild coronary artery calcification. Aortic Atherosclerosis (ICD10-I70.0). Electronically Signed   By: Fidela Salisbury M.D.   On: 06/12/2022 21:25   DG Chest 2 View  Result Date: 06/12/2022 CLINICAL DATA:  Chest pain located under the left breast. [place] of breath. EXAM: CHEST - 2 VIEW COMPARISON:  None Available. FINDINGS: Cardiac silhouette mildly enlarged. No mediastinal or hilar masses. No evidence of adenopathy. Lungs are clear.  No pleural effusion or pneumothorax. Skeletal structures are intact. IMPRESSION: No acute cardiopulmonary disease. Electronically Signed   By: Shanon Brow  Ormond M.D.   On: 06/12/2022 17:15      Subjective: She does not report any further chest discomfort.  She is eager to discharge home.  Discharge Exam: Vitals:   06/14/22 0800 06/14/22 1000 06/14/22 1107 06/14/22 1200  BP: (!) 183/63 (!) 140/55  (!) 159/57  Pulse: 73 65  68  Resp: 18 18  16   Temp:   98.1 F (36.7 C)   TempSrc:   Oral   SpO2: 93% 97%  95%    General: Pt is alert, awake, not in acute distress Cardiovascular: RRR, S1/S2 +, no rubs, no gallops Respiratory: CTA bilaterally, no wheezing, no rhonchi Abdominal: Soft, NT, ND, bowel sounds + Extremities: no edema, no cyanosis    The results of significant diagnostics from this hospitalization (including imaging, microbiology, ancillary and laboratory) are listed below for reference.     Microbiology: No results found for this or any previous visit (from the past 240 hour(s)).   Labs: BNP (last 3 results) Recent Labs    06/12/22 1600  BNP 928.8*   Basic Metabolic Panel: Recent Labs  Lab 06/12/22 1600 06/12/22 1808 06/13/22 0452 06/14/22 0419  NA 139  --  138 135  K 4.4  --  3.8 4.2  CL 106  --  108 103  CO2 21*  --  20* 19*  GLUCOSE 166*  --  156* 126*  BUN 25*  --  26* 32*  CREATININE 1.27*  --  1.25* 1.51*  CALCIUM 9.7  --  9.0 9.1  MG  --  1.9 1.7  --    Liver Function Tests: Recent Labs  Lab  06/13/22 0452  AST 18  ALT 16  ALKPHOS 77  BILITOT 0.4  PROT 6.7  ALBUMIN 3.9   No results for input(s): "LIPASE", "AMYLASE" in the last 168 hours. No results for input(s): "AMMONIA" in the last 168 hours. CBC: Recent Labs  Lab 06/12/22 1600 06/14/22 0419  WBC 13.5* 9.7  NEUTROABS 10.8*  --   HGB 13.9 13.0  HCT 42.4 40.7  MCV 92.4 93.8  PLT 383 355   Cardiac Enzymes: No results for input(s): "CKTOTAL", "CKMB", "CKMBINDEX", "TROPONINI" in the last 168 hours. BNP: Invalid input(s): "POCBNP" CBG: No results for input(s): "GLUCAP" in the last 168 hours. D-Dimer Recent Labs    06/12/22 1808  DDIMER 0.93*   Hgb A1c Recent Labs    06/13/22 0010  HGBA1C 6.1*   Lipid Profile Recent Labs    06/13/22 0010  CHOL 165  HDL 55  LDLCALC 82  TRIG 141  CHOLHDL 3.0   Thyroid function studies No results for input(s): "TSH", "T4TOTAL", "T3FREE", "THYROIDAB" in the last 72 hours.  Invalid input(s): "FREET3" Anemia work up No results for input(s): "VITAMINB12", "FOLATE", "FERRITIN", "TIBC", "IRON", "RETICCTPCT" in the last 72 hours. Urinalysis No results found for: "COLORURINE", "APPEARANCEUR", "LABSPEC", "PHURINE", "GLUCOSEU", "HGBUR", "BILIRUBINUR", "KETONESUR", "PROTEINUR", "UROBILINOGEN", "NITRITE", "LEUKOCYTESUR" Sepsis Labs Recent Labs  Lab 06/12/22 1600 06/14/22 0419  WBC 13.5* 9.7   Microbiology No results found for this or any previous visit (from the past 240 hour(s)).   Time coordinating discharge: 06/16/22  SIGNED:   , MD  [facility] 06/14/2022, 12:19 PM   If 7PM-7AM, please contact night-coverage [email or web address]

## 2022-06-14 NOTE — ED Notes (Signed)
Patient complaining of a headache, PRN tylenol provided.

## 2022-06-14 NOTE — Progress Notes (Addendum)
Rounding Note    Patient Name: Deborah Logan Date of Encounter: 06/14/2022  [place] Cardiologist: Mertie Moores, MD   Subjective   No chest pain only some occ back pain  pt anxious to go home.  Inpatient Medications    Scheduled Meds:  allopurinol  100 mg Oral Daily   amLODipine  5 mg Oral Daily   carvedilol  25 mg Oral BID WC   heparin  5,000 Units Subcutaneous Q8H   rosuvastatin  5 mg Oral Daily   Continuous Infusions:  PRN Meds: acetaminophen **OR** acetaminophen, hydrALAZINE, hydrOXYzine, melatonin, nitroGLYCERIN, ondansetron **OR** ondansetron (ZOFRAN) IV   Vital Signs    Vitals:   06/14/22 0715 06/14/22 0800 06/14/22 1000 06/14/22 1107  BP: (!) 161/50 (!) 183/63 (!) 140/55   Pulse: 66 73 65   Resp: 16 18 18    Temp: 98.2 F (36.8 C)   98.1 F (36.7 C)  TempSrc:    Oral  SpO2: 97% 93% 97%    No intake or output data in the 24 hours ending 06/14/22 1120     No data to display            Telemetry    SR with bigeminy PVCs freq  - Personally Reviewed  ECG    No new EKG - Personally Reviewed  Physical Exam   GEN: No acute distress.   Neck: No JVD Cardiac: RRR, no murmurs, rubs, or gallops.  Respiratory: Clear to auscultation bilaterally. GI: Soft, nontender, non-distended  MS: No edema; No deformity. Neuro:  Nonfocal  Psych: Normal affect   Labs    High Sensitivity Troponin:   Recent Labs  Lab 06/12/22 1600 06/12/22 1808 06/12/22 2213 06/13/22 0010  TROPONINIHS 14 18* 21* 16     Chemistry Recent Labs  Lab 06/12/22 1600 06/12/22 1808 06/13/22 0452 06/14/22 0419  NA 139  --  138 135  K 4.4  --  3.8 4.2  CL 106  --  108 103  CO2 21*  --  20* 19*  GLUCOSE 166*  --  156* 126*  BUN 25*  --  26* 32*  CREATININE 1.27*  --  1.25* 1.51*  CALCIUM 9.7  --  9.0 9.1  MG  --  1.9 1.7  --   PROT  --   --  6.7  --   ALBUMIN  --   --  3.9  --   AST  --   --  18  --   ALT  --   --  16  --   ALKPHOS  --   --  77  --    BILITOT  --   --  0.4  --   GFRNONAA 44*  --  44* 35*  ANIONGAP 12  --  10 13    Lipids  Recent Labs  Lab 06/13/22 0010  CHOL 165  TRIG 141  HDL 55  LDLCALC 82  CHOLHDL 3.0    Hematology Recent Labs  Lab 06/12/22 1600 06/14/22 0419  WBC 13.5* 9.7  RBC 4.59 4.34  HGB 13.9 13.0  HCT 42.4 40.7  MCV 92.4 93.8  MCH 30.3 30.0  MCHC 32.8 31.9  RDW 13.2 13.6  PLT 383 355   Thyroid No results for input(s): "TSH", "FREET4" in the last 168 hours.  BNP Recent Labs  Lab 06/12/22 1600  BNP 928.8*    DDimer  Recent Labs  Lab 06/12/22 1808  DDIMER 0.93*     Radiology  ECHOCARDIOGRAM COMPLETE  Result Date: 06/13/2022    ECHOCARDIOGRAM REPORT   Patient Name:   GRACY EHLY Date of Exam: 06/13/2022 Medical Rec #:  [id number]    Height: Accession #:    [id number]   Weight: Date of Birth:  September 16, 1945    BSA: Patient Age:    76 years     BP:           166/70 mmHg Patient Gender: F            HR:           77 bpm. Exam Location:  Inpatient Procedure: 2D Echo, Cardiac Doppler, Color Doppler and Intracardiac            Opacification Agent Indications:    Pulmonary hypertension  History:        Patient has no prior history of Echocardiogram examinations.                 Risk Factors:Hypertension and Dyslipidemia. CKD.  Sonographer:    Ross Ludwig RDCS (AE) Referring Phys: ERIC CHEN  Sonographer Comments: Technically difficult study due to poor echo windows. Image acquisition challenging due to breast implants. IMPRESSIONS  1. Left ventricular ejection fraction, by estimation, is 50-60%, varying based on frequent PVC's. The left ventricle has normal function. The left ventricle has no regional wall motion abnormalities. There is mild left ventricular hypertrophy. Left ventricular diastolic parameters are consistent with Grade I diastolic dysfunction (impaired relaxation).  2. Right ventricular systolic function is normal. The right ventricular size is normal. Tricuspid regurgitation signal is  inadequate for assessing PA pressure.  3. The mitral valve is grossly normal. Trivial mitral valve regurgitation.  4. The aortic valve was not well visualized. Aortic valve regurgitation is not visualized.  5. Aortic dilatation noted. There is borderline dilatation of the ascending aorta, measuring 39 mm.  6. The inferior vena cava is normal in size with greater than 50% respiratory variability, suggesting right atrial pressure of 3 mmHg. Comparison(s): No prior Echocardiogram. FINDINGS  Left Ventricle: Left ventricular ejection fraction, by estimation, is 50-60%. The left ventricle has normal function. The left ventricle has no regional wall motion abnormalities. Definity contrast agent was given IV to delineate the left ventricular endocardial borders. The left ventricular internal cavity size was normal in size. There is mild left ventricular hypertrophy. Left ventricular diastolic parameters are consistent with Grade I diastolic dysfunction (impaired relaxation). Indeterminate filling pressures. Right Ventricle: The right ventricular size is normal. No increase in right ventricular wall thickness. Right ventricular systolic function is normal. Tricuspid regurgitation signal is inadequate for assessing PA pressure. Left Atrium: Left atrial size was normal in size. Right Atrium: Right atrial size was normal in size. Pericardium: There is no evidence of pericardial effusion. Mitral Valve: The mitral valve is grossly normal. Trivial mitral valve regurgitation. Tricuspid Valve: The tricuspid valve is normal in structure. Tricuspid valve regurgitation is not demonstrated. Aortic Valve: The aortic valve was not well visualized. Aortic valve regurgitation is not visualized. Pulmonic Valve: The pulmonic valve was not well visualized. Pulmonic valve regurgitation is not visualized. Aorta: Aortic dilatation noted. There is borderline dilatation of the ascending aorta, measuring 39 mm. Venous: The inferior vena cava is  normal in size with greater than 50% respiratory variability, suggesting right atrial pressure of 3 mmHg. IAS/Shunts: No atrial level shunt detected by color flow Doppler.  LEFT VENTRICLE PLAX 2D LVIDd:         4.80 cm LVIDs:  3.50 cm LV PW:         1.20 cm LV IVS:        1.10 cm LVOT diam:     1.90 cm LV SV:         56 LVOT Area:     2.84 cm  RIGHT VENTRICLE             IVC RV Basal diam:  2.80 cm     IVC diam: 1.80 cm RV S prime:     18.70 cm/s TAPSE (M-mode): 3.2 cm LEFT ATRIUM             RIGHT ATRIUM LA diam:        4.80 cm RA Area:     10.10 cm LA Vol (A2C):   36.8 ml RA Volume:   18.90 ml LA Vol (A4C):   59.9 ml LA Biplane Vol: 50.6 ml  AORTIC VALVE LVOT Vmax:   80.40 cm/s LVOT Vmean:  53.400 cm/s LVOT VTI:    0.197 m  AORTA Ao Root diam: 3.00 cm Ao Asc diam:  3.90 cm MITRAL VALVE MV Area (PHT): 4.31 cm     SHUNTS MV Decel Time: 176 msec     Systemic VTI:  0.20 m MV E velocity: 112.00 cm/s  Systemic Diam: 1.90 cm MV A velocity: 103.00 cm/s MV E/A ratio:  1.09 Lyman Bishop MD Electronically signed by Lyman Bishop MD Signature Date/Time: 06/13/2022/5:08:26 PM    Final    CT Angio Chest PE W and/or Wo Contrast  Result Date: 06/12/2022 CLINICAL DATA:  Pulmonary embolism (PE) suspected, positive D-dimer. Chest pain. EXAM: CT ANGIOGRAPHY CHEST WITH CONTRAST TECHNIQUE: Multidetector CT imaging of the chest was performed using the standard protocol during bolus administration of intravenous contrast. Multiplanar CT image reconstructions and MIPs were obtained to evaluate the vascular anatomy. RADIATION DOSE REDUCTION: This exam was performed according to the departmental dose-optimization program which includes automated exposure control, adjustment of the mA and/or kV according to patient size and/or use of iterative reconstruction technique. CONTRAST:  35mL OMNIPAQUE IOHEXOL 350 MG/ML SOLN COMPARISON:  None Available. FINDINGS: Cardiovascular: There is adequate opacification of the pulmonary  arterial tree. No intraluminal filling defect identified to suggest acute pulmonary embolism. Central pulmonary arteries are enlarged in keeping with changes of pulmonary arterial hypertension. Mild global cardiomegaly. Mild coronary artery calcification. No pericardial effusion. Mild atherosclerotic calcification within the thoracic aorta. No aortic aneurysm. Mediastinum/Nodes: No enlarged mediastinal, hilar, or axillary lymph nodes. Thyroid gland, trachea, and esophagus demonstrate no significant findings. Lungs/Pleura: Lungs are clear. No pleural effusion or pneumothorax. Upper Abdomen: No acute abnormality. Musculoskeletal: Bilateral rim calcified breast implants are seen. Osseous structures are age-appropriate. No acute bone abnormality. Review of the MIP images confirms the above findings. IMPRESSION: 1. No pulmonary embolism. No acute intrathoracic pathology identified. 2. Morphologic changes in keeping with pulmonary arterial hypertension. 3. Mild global cardiomegaly. Mild coronary artery calcification. Aortic Atherosclerosis (ICD10-I70.0). Electronically Signed   By: Fidela Salisbury M.D.   On: 06/12/2022 21:25   DG Chest 2 View  Result Date: 06/12/2022 CLINICAL DATA:  Chest pain located under the left breast. [place] of breath. EXAM: CHEST - 2 VIEW COMPARISON:  None Available. FINDINGS: Cardiac silhouette mildly enlarged. No mediastinal or hilar masses. No evidence of adenopathy. Lungs are clear.  No pleural effusion or pneumothorax. Skeletal structures are intact. IMPRESSION: No acute cardiopulmonary disease. Electronically Signed   By: Lajean Manes M.D.   On: 06/12/2022 17:15    Cardiac  Studies   TTE 06/13/22  IMPRESSIONS     1. Left ventricular ejection fraction, by estimation, is 50-60%, varying  based on frequent PVC's. The left ventricle has normal function. The left  ventricle has no regional wall motion abnormalities. There is mild left  ventricular hypertrophy. Left  ventricular  diastolic parameters are consistent with Grade I diastolic  dysfunction (impaired relaxation).   2. Right ventricular systolic function is normal. The right ventricular  size is normal. Tricuspid regurgitation signal is inadequate for assessing  PA pressure.   3. The mitral valve is grossly normal. Trivial mitral valve  regurgitation.   4. The aortic valve was not well visualized. Aortic valve regurgitation  is not visualized.   5. Aortic dilatation noted. There is borderline dilatation of the  ascending aorta, measuring 39 mm.   6. The inferior vena cava is normal in size with greater than 50%  respiratory variability, suggesting right atrial pressure of 3 mmHg.   Comparison(s): No prior Echocardiogram.   FINDINGS   Left Ventricle: Left ventricular ejection fraction, by estimation, is  50-60%. The left ventricle has normal function. The left ventricle has no  regional wall motion abnormalities. Definity contrast agent was given IV  to delineate the left ventricular  endocardial borders. The left ventricular internal cavity size was normal  in size. There is mild left ventricular hypertrophy. Left ventricular  diastolic parameters are consistent with Grade I diastolic dysfunction  (impaired relaxation). Indeterminate  filling pressures.   Right Ventricle: The right ventricular size is normal. No increase in  right ventricular wall thickness. Right ventricular systolic function is  normal. Tricuspid regurgitation signal is inadequate for assessing PA  pressure.   Left Atrium: Left atrial size was normal in size.   Right Atrium: Right atrial size was normal in size.   Pericardium: There is no evidence of pericardial effusion.   Mitral Valve: The mitral valve is grossly normal. Trivial mitral valve  regurgitation.   Tricuspid Valve: The tricuspid valve is normal in structure. Tricuspid  valve regurgitation is not demonstrated.   Aortic Valve: The aortic valve was not well  visualized. Aortic valve  regurgitation is not visualized.   Pulmonic Valve: The pulmonic valve was not well visualized. Pulmonic valve  regurgitation is not visualized.   Aorta: Aortic dilatation noted. There is borderline dilatation of the  ascending aorta, measuring 39 mm.   Venous: The inferior vena cava is normal in size with greater than 50%  respiratory variability, suggesting right atrial pressure of 3 mmHg.   IAS/Shunts: No atrial level shunt detected by color flow Doppler.   Patient Profile     76 y.o. female  hx of HTN, HLD, gout, obesity and CKD stage IIIb  admitted with chest pain and hypertensive urgency in setting of missing her home meds.    Assessment & Plan   Chest pain: ECG with no acute changes. HS trop 18-->21-->16.  She does has risk factors for CAD including obesity, hyperlipidemia, hypertension, pre diabetes and CT chest showed aortic atherosclerosis & coronary calcifications. She will need an ischemic w/u. Echo EF 50-60%  no RWMA.  G1DD.  Minimal MR --possible outpt nuc study or cardiac CTA. Dr. [name] to see.    Hypertensive urgency: presented with chest pain and elevated BNP in the setting of elevated blood pressure.  This has improved on home meds as well as as needed hydralazine. Will make make medication changes after results of echo returns. If she has reduced EF, we  would treat her hypertension in the context of CHF.  --today 183/73 to 140/55 --amlodipine added at 5 mg and atenolol changed to coreg 25 BID    PVCs: continue coreg.  May need 2 week monitor?  Has never been told she had premature beats  we did adjust BB stopping atenolol and adding coreg   HLD: continue statin   CKD stage IIIb: creatinine 1.25 with a GFR 44, continue to monitor.  --today 1.51  --losartan started yesterday stopped.   For questions or updates, please contact [place] Please consult [email or web address] for contact info under     Signed, Cecilie Kicks, NP   06/14/2022, 11:20 AM   As above, patient seen and examined.  Patient denies chest pain or dyspnea.  Blood pressure has improved but remains mildly elevated.  Troponins are not consistent with infarct.  However she does have chest pain with some concerning features.  I recommended inpatient evaluation today.  I do not think she would be a good candidate for cardiac CTA as she has frequent ectopy on telemetry.  I did recommend a stress nuclear study to screen for significant coronary disease.  However she is hesitant and would like to consider her options for 2 days prior to proceeding.  She requested to be discharged and I did explain there was some risk of infarct from the time she is discharged until she gets her stress test and she understands.  We will arrange an outpatient stress nuclear study and then follow-up with APP immediately afterwards.  If abnormal she will require cardiac catheterization.  Note her echocardiogram shows normal LV function.  Continue carvedilol and amlodipine for blood pressure.  We will follow and advance amlodipine if needed.  She does have PVCs that are frequent but her LV function is normal on echocardiogram.  Continue beta-blocker. Kirk Ruths

## 2022-06-27 NOTE — Progress Notes (Signed)
Office Visit    Patient Name: Deborah Logan Date of Encounter: 06/28/2022  Primary Care Provider:  Pcp, No Primary Cardiologist:  Mertie Moores, MD Primary Electrophysiologist: None  Chief Complaint    Deborah Logan is a 76 y.o. female with PMH of HTN, HLD, obesity, CKD stage IIIb, gout, prediabetes who presents today for post hospital follow-up of chest pain and hypertension.  Past Medical History    Past Medical History:  Diagnosis Date   HLD (hyperlipidemia)    HTN (hypertension) 06/12/2022   Hyperlipidemia 06/12/2022   Hypertension    Stage 3b chronic kidney disease (CKD) ([place]) 06/12/2022   Past Surgical History:  Procedure Laterality Date   PLACEMENT OF BREAST IMPLANTS Bilateral     Allergies  No Known Allergies  History of Present Illness    Deborah Logan  is a 76 year old female with the above mention past medical history who presents today for posthospital follow-up of hypertension and chest pain.  [name] presented to the urgent care on 06/12/2022 for complaint of chest pain and was sent to the emergency room and found to have hypertensive urgency.  She reported missing her home medications and was restarted on atenolol, benzapril and hydralazine.  EKG was completed and showed no acute changes but did note first-degree AV block with ventricular bigeminy.  Coronary CT was completed and was negative for acute PE with D-dimer mildly elevated.  Chest x-ray was also negative for cardiopulmonary abnormality.  BNP was found to be elevated at 928.8 and troponins were trending 18. 21.  2D echo was completed and revealed EF of 50-60%, normal LV function with mild LVH and grade 1 DD, trivial MVR and aortic dilation measuring 39 mm.  Blood pressures improved with home medications and amlodipine 5 mg was added with discontinuation of atenolol to carvedilol 25 mg twice daily.  [name] presents today for post ED follow-up alone.  Since last being seen in the office patient  reports that she is doing well and notes no chest discomfort since ED visit.  Her blood pressure today was elevated at 156/80 and was 162/82 on recheck.  She denies any chest discomfort or headaches at this time.  She is compliant with her current medication regimen and denies any missed doses during our visit we discussed the importance of medication compliance and also reviewed primary and secondary prevention for managing cardiovascular disease.  She reports that over the past few weeks she has eaten a diet high in sodium for several doses of her blood pressure medications.  She also reports drinking alcoholic beverages daily.  She reports some dizziness with carvedilol but denies any presyncope.  We discussed and reviewed possible ischemic evaluation to chest pain that occurred during hypertensive urgency.  She requested to think about her options and follow through with testing at a later time.  Patient denies chest pain, palpitations, dyspnea, PND, orthopnea, nausea, vomiting, dizziness, syncope, edema, weight gain, or early satiety.   Home Medications    Current Outpatient Medications  Medication Sig Dispense Refill   allopurinol (ZYLOPRIM) 100 MG tablet Take 100 mg by mouth daily.     carvedilol (COREG) 25 MG tablet Take 1 tablet (25 mg total) by mouth 2 (two) times daily with a meal. 60 tablet 1   rosuvastatin (CRESTOR) 5 MG tablet Take 5 mg by mouth daily.     vitamin D3 (CHOLECALCIFEROL) 25 MCG tablet Take 1,000 Units by mouth daily.  amLODipine (NORVASC) 10 MG tablet Take 1 tablet (10 mg total) by mouth daily. 30 tablet 1   No current facility-administered medications for this visit.     Review of Systems  Please see the history of present illness.    (+) Dizziness (+) Anxiety  All other systems reviewed and are otherwise negative except as noted above.  Physical Exam    Wt Readings from Last 3 Encounters:  06/28/22 194 lb (88 kg)   VS: Vitals:   06/28/22 1054 06/28/22  1118  BP: (!) 156/80 (!) 162/82  Pulse:    SpO2:    ,Body mass index is 32.28 kg/m.  Constitutional:      Appearance: Healthy appearance. Not in distress.  Neck:     Vascular: JVD normal.  Pulmonary:     Effort: Pulmonary effort is normal.     Breath sounds: No wheezing. No rales. Diminished in the bases Cardiovascular:     Normal rate. Regular rhythm. Normal S1. Normal S2.      Murmurs: There is no murmur.  Edema:    Peripheral edema absent.  Abdominal:     Palpations: Abdomen is soft non tender. There is no hepatomegaly.  Skin:    General: Skin is warm and dry.  Neurological:     General: No focal deficit present.     Mental Status: Alert and oriented to person, place and time.     Cranial Nerves: Cranial nerves are intact.  EKG/LABS/Other Studies Reviewed    ECG personally reviewed by me today -sinus rhythm with first-degree AV block with bigeminy and PACs with left axis deviation and rate of 70 bpm.   Lab Results  Component Value Date   WBC 9.7 06/14/2022   HGB 13.0 06/14/2022   HCT 40.7 06/14/2022   MCV 93.8 06/14/2022   PLT 355 06/14/2022   Lab Results  Component Value Date   CREATININE 1.51 (H) 06/14/2022   BUN 32 (H) 06/14/2022   NA 135 06/14/2022   K 4.2 06/14/2022   CL 103 06/14/2022   CO2 19 (L) 06/14/2022   Lab Results  Component Value Date   ALT 16 06/13/2022   AST 18 06/13/2022   ALKPHOS 77 06/13/2022   BILITOT 0.4 06/13/2022   Lab Results  Component Value Date   CHOL 165 06/13/2022   HDL 55 06/13/2022   LDLCALC 82 06/13/2022   TRIG 141 06/13/2022   CHOLHDL 3.0 06/13/2022    Lab Results  Component Value Date   HGBA1C 6.1 (H) 06/13/2022    Assessment & Plan    1.  Hypertensive urgency: -Patient recently admitted with complaint of chest pain and elevated blood pressures following missed doses of home medications. -Continue Coreg 25 mg twice daily, and increase Norvasc to 10 mg daily -Patient will check blood pressures over the next  2 weeks with goal of 130/80 or less -HYPERTENSION CONTROL Vitals:   06/28/22 1040 06/28/22 1054 06/28/22 1118  BP: (!) 210/70 (!) 156/80 (!) 162/82    The patient's blood pressure is elevated above target today.  In order to address the patient's elevated BP: Blood pressure will be monitored at home to determine if medication changes need to be made.; A new medication was prescribed today.      2.  Chest pain: -Today patient reports no chest pain or recurrence since discharge -We discussed the need for possible ischemic evaluation with cardiac CTA or stress test. -She would like to think about her testing options and we  will revisit at her follow-up  3.  History of PVCs: -PVCs noted on telemetry during hospitalization with bigeminy pattern -Patient was advised to order a event monitor to quantify PVC burden.  At this time patient would like to monitor symptoms and rediscuss at her follow-up. -Continue beta-blocker therapy  4.  Stage IIIb CKD: -Creatinine on 11/13 was 1.51 we will recheck at follow-up with plan to refer to nephrology if elevated  Disposition: Follow-up with Mertie Moores, MD or APP in 1 months    Medication Adjustments/Labs and Tests Ordered: Current medicines are reviewed at length with the patient today.  Concerns regarding medicines are outlined above.   Signed, Mable Fill, Marissa Nestle, NP 06/28/2022, 12:24 PM [facility]  Note:  This document was prepared using Dragon voice recognition software and may include unintentional dictation errors.

## 2022-06-28 ENCOUNTER — Other Ambulatory Visit: Payer: Self-pay | Admitting: Nurse Practitioner

## 2022-06-28 ENCOUNTER — Encounter: Payer: Self-pay | Admitting: Nurse Practitioner

## 2022-06-28 ENCOUNTER — Ambulatory Visit: Payer: Medicare Other | Attending: Nurse Practitioner | Admitting: Nurse Practitioner

## 2022-06-28 VITALS — BP 162/82 | HR 70 | Ht 65.0 in | Wt 194.0 lb

## 2022-06-28 DIAGNOSIS — N1832 Chronic kidney disease, stage 3b: Secondary | ICD-10-CM

## 2022-06-28 DIAGNOSIS — I498 Other specified cardiac arrhythmias: Secondary | ICD-10-CM

## 2022-06-28 DIAGNOSIS — R0789 Other chest pain: Secondary | ICD-10-CM | POA: Diagnosis not present

## 2022-06-28 DIAGNOSIS — I160 Hypertensive urgency: Secondary | ICD-10-CM | POA: Diagnosis not present

## 2022-06-28 MED ORDER — AMLODIPINE BESYLATE 10 MG PO TABS: 10.0000 mg | ORAL_TABLET | Freq: Every day | ORAL | 1 refills | Status: DC

## 2022-06-28 NOTE — Patient Instructions (Addendum)
Medication Instructions:  INCREASE Norvasc to 10mg  take 1 tablet once a day  *If you need a refill on your cardiac medications before your next appointment, please call your pharmacy*   Lab Work: None ordered   Testing/Procedures: None Ordered   Follow-Up: At [facility], you and your health needs are our priority.  As part of our continuing mission to provide you with exceptional heart care, we have created designated Provider Care Teams.  These Care Teams include your primary Cardiologist (physician) and Advanced Practice Providers (APPs -  Physician Assistants and Nurse Practitioners) who all work together to provide you with the care you need, when you need it.  We recommend signing up for the patient portal called "MyChart".  Sign up information is provided on this After Visit Summary.  MyChart is used to connect with patients for Virtual Visits (Telemedicine).  Patients are able to view lab/test results, encounter notes, upcoming appointments, etc.  Non-urgent messages can be sent to your provider as well.   To learn more about what you can do with MyChart, go to [facility].    Your next appointment:   1 month(s)  The format for your next appointment:   In Person  Provider:   [email or web address], NP       Other Instructions CHECK YOUR BLOOD PRESSURE DAILY FOR 2 WEEKS THEN CONTACT THE OFFICE WITH YOUR READINGS. YOUR GOAL BLOOD PRESSURE IS 130/80  Important Information About Sugar

## 2022-07-01 NOTE — Addendum Note (Signed)
Addended by: Dennis Bast F on: 07/01/2022 04:43 PM   Modules accepted: Orders

## 2022-07-30 MED ORDER — CARVEDILOL 25 MG PO TABS: 37.5000 mg | ORAL_TABLET | Freq: Two times a day (BID) | ORAL | 1 refills | Status: DC

## 2022-08-10 NOTE — Progress Notes (Unsigned)
Office Visit    Patient Name: Deborah Logan Date of Encounter: 08/10/2022  Primary Care Provider:  Pcp, No Primary Cardiologist:  Mertie Moores, MD Primary Electrophysiologist: None  Chief Complaint    Deborah Logan is a 77 y.o. female with PMH of HTN, HLD, obesity, CKD stage IIIb, gout, prediabetes who presents today for post hospital follow-up of chest pain and hypertension.   Past Medical History    Past Medical History:  Diagnosis Date   HLD (hyperlipidemia)    HTN (hypertension) 06/12/2022   Hyperlipidemia 06/12/2022   Hypertension    Stage 3b chronic kidney disease (CKD) ([place]) 06/12/2022   Past Surgical History:  Procedure Laterality Date   PLACEMENT OF BREAST IMPLANTS Bilateral     Allergies  No Known Allergies  History of Present Illness    Deborah Logan  is a 77 year old female with the above mention past medical history who presents today for posthospital follow-up of hypertension and chest pain.  Ms. Smylie presented to the urgent care on 06/12/2022 for complaint of chest pain and was sent to the emergency room and found to have hypertensive urgency.    She reported missing her home medications and was restarted on atenolol, benzapril and hydralazine.  EKG was completed and showed no acute changes but did note first-degree AV block with ventricular bigeminy.  Coronary CT was completed and was negative for acute PE with D-dimer mildly elevated.  Chest x-ray was also negative for cardiopulmonary abnormality.  BNP was found to be elevated at 928.8 and troponins were trending 18. 21.  2D echo was completed and revealed EF of 50-60%, normal LV function with mild LVH and grade 1 DD, trivial MVR and aortic dilation measuring 39 mm.  Blood pressures improved with home medications and amlodipine 5 mg was added with discontinuation of atenolol to carvedilol 25 mg twice daily.  During her follow-up 11/27 patient had elevated BP that occurred following missed doses of her home  medication.  Norvasc was increased to 10 mg daily   Since last being seen in the office patient reports***.  Patient denies chest pain, palpitations, dyspnea, PND, orthopnea, nausea, vomiting, dizziness, syncope, edema, weight gain, or early satiety.     ***Notes:  Home Medications    Current Outpatient Medications  Medication Sig Dispense Refill   allopurinol (ZYLOPRIM) 100 MG tablet Take 100 mg by mouth daily.     amLODipine (NORVASC) 10 MG tablet TAKE 1 TABLET(10 MG) BY MOUTH DAILY 90 tablet 0   carvedilol (COREG) 25 MG tablet Take 1.5 tablets (37.5 mg total) by mouth 2 (two) times daily with a meal. 270 tablet 1   rosuvastatin (CRESTOR) 5 MG tablet Take 5 mg by mouth daily.     vitamin D3 (CHOLECALCIFEROL) 25 MCG tablet Take 1,000 Units by mouth daily.     No current facility-administered medications for this visit.     Review of Systems  Please see the history of present illness.    (+)*** (+)***  All other systems reviewed and are otherwise negative except as noted above.  Physical Exam    Wt Readings from Last 3 Encounters:  06/28/22 194 lb (88 kg)   OM:[facility] were no vitals filed for this visit.,There is no height or weight on file to calculate BMI.  Constitutional:      Appearance: Healthy appearance. Not in distress.  Neck:     Vascular: JVD normal.  Pulmonary:     Effort: Pulmonary effort  is normal.     Breath sounds: No wheezing. No rales. Diminished in the bases Cardiovascular:     Normal rate. Regular rhythm. Normal S1. Normal S2.      Murmurs: There is no murmur.  Edema:    Peripheral edema absent.  Abdominal:     Palpations: Abdomen is soft non tender. There is no hepatomegaly.  Skin:    General: Skin is warm and dry.  Neurological:     General: No focal deficit present.     Mental Status: Alert and oriented to person, place and time.     Cranial Nerves: Cranial nerves are intact.  EKG/LABS/Other Studies Reviewed    ECG personally reviewed by  me today - ***  Risk Assessment/Calculations:   {Does this patient have ATRIAL FIBRILLATION?[phone]}        Lab Results  Component Value Date   WBC 9.7 06/14/2022   HGB 13.0 06/14/2022   HCT 40.7 06/14/2022   MCV 93.8 06/14/2022   PLT 355 06/14/2022   Lab Results  Component Value Date   CREATININE 1.51 (H) 06/14/2022   BUN 32 (H) 06/14/2022   NA 135 06/14/2022   K 4.2 06/14/2022   CL 103 06/14/2022   CO2 19 (L) 06/14/2022   Lab Results  Component Value Date   ALT 16 06/13/2022   AST 18 06/13/2022   ALKPHOS 77 06/13/2022   BILITOT 0.4 06/13/2022   Lab Results  Component Value Date   CHOL 165 06/13/2022   HDL 55 06/13/2022   LDLCALC 82 06/13/2022   TRIG 141 06/13/2022   CHOLHDL 3.0 06/13/2022    Lab Results  Component Value Date   HGBA1C 6.1 (H) 06/13/2022    Assessment & Plan    1.  Essential hypertension: -Patient's BP was elevated during previous visit due to missing doses of medication.  She was instructed to check blood pressures at home. -Today patient's blood pressure is***   2.  Chest pain: -Today patient reports***  3.  History of PVCs: -PVCs noted on telemetry during hospitalization with bigeminy pattern -Patient was advised to order a event monitor to quantify PVC burden.  At this time patient would like to monitor symptoms and rediscuss at her follow-up. -Continue beta-blocker therapy   4.  Stage IIIb CKD: -Creatinine on 11/13 was 1.51 we will recheck at follow-up with plan to refer to nephrology if elevated     Disposition: Follow-up with Kristeen Miss, MD or APP in *** months {Are you ordering a CV Procedure (e.g. stress test, cath, DCCV, TEE, etc)?   Press F2        :[id number]}   Medication Adjustments/Labs and Tests Ordered: Current medicines are reviewed at length with the patient today.  Concerns regarding medicines are outlined above.   Signed, Napoleon Form, Leodis Rains, NP 08/10/2022, 8:54 PM [facility]  Note:  This document was prepared using Dragon voice recognition software and may include unintentional dictation errors.

## 2022-08-11 ENCOUNTER — Ambulatory Visit: Payer: Medicare Other | Attending: Nurse Practitioner | Admitting: Nurse Practitioner

## 2022-08-11 ENCOUNTER — Encounter: Payer: Self-pay | Admitting: Nurse Practitioner

## 2022-08-11 VITALS — BP 136/72 | HR 74 | Ht 65.0 in | Wt 187.2 lb

## 2022-08-11 DIAGNOSIS — I493 Ventricular premature depolarization: Secondary | ICD-10-CM | POA: Insufficient documentation

## 2022-08-11 DIAGNOSIS — R0789 Other chest pain: Secondary | ICD-10-CM | POA: Insufficient documentation

## 2022-08-11 DIAGNOSIS — I10 Essential (primary) hypertension: Secondary | ICD-10-CM | POA: Insufficient documentation

## 2022-08-11 DIAGNOSIS — N1832 Chronic kidney disease, stage 3b: Secondary | ICD-10-CM | POA: Diagnosis not present

## 2022-08-11 NOTE — Patient Instructions (Signed)
Medication Instructions:  Your physician recommends that you continue on your current medications as directed. Please refer to the Current Medication list given to you today. *If you need a refill on your cardiac medications before your next appointment, please call your pharmacy*   Lab Work: None Ordered   Testing/Procedures: None Ordered   Follow-Up: At [facility], you and your health needs are our priority.  As part of our continuing mission to provide you with exceptional heart care, we have created designated Provider Care Teams.  These Care Teams include your primary Cardiologist (physician) and Advanced Practice Providers (APPs -  Physician Assistants and Nurse Practitioners) who all work together to provide you with the care you need, when you need it.  We recommend signing up for the patient portal called "MyChart".  Sign up information is provided on this After Visit Summary.  MyChart is used to connect with patients for Virtual Visits (Telemedicine).  Patients are able to view lab/test results, encounter notes, upcoming appointments, etc.  Non-urgent messages can be sent to your provider as well.   To learn more about what you can do with MyChart, go to [email or web address].    Your next appointment:   6 month(s)  The format for your next appointment:   In Person  Provider:   Mertie Moores, MD     Other Instructions Continue checking your blood pressure daily.  Important Information About Sugar

## 2022-09-21 ENCOUNTER — Other Ambulatory Visit: Payer: Self-pay | Admitting: Nurse Practitioner

## 2022-09-28 DIAGNOSIS — C44311 Basal cell carcinoma of skin of nose: Secondary | ICD-10-CM | POA: Diagnosis not present

## 2022-09-28 DIAGNOSIS — D485 Neoplasm of uncertain behavior of skin: Secondary | ICD-10-CM | POA: Diagnosis not present

## 2022-11-01 ENCOUNTER — Other Ambulatory Visit: Payer: Self-pay

## 2022-11-01 MED ORDER — CARVEDILOL 25 MG PO TABS: 37.5000 mg | ORAL_TABLET | Freq: Two times a day (BID) | ORAL | 3 refills | Status: DC

## 2022-11-02 ENCOUNTER — Other Ambulatory Visit: Payer: Self-pay | Admitting: Nurse Practitioner

## 2022-11-02 MED ORDER — CARVEDILOL 25 MG PO TABS: 37.5000 mg | ORAL_TABLET | Freq: Two times a day (BID) | ORAL | 0 refills | Status: DC

## 2022-11-02 NOTE — Addendum Note (Signed)
Addended by: Vergia Alcon A on: 11/02/2022 12:47 PM   Modules accepted: Orders

## 2022-11-22 DIAGNOSIS — C44311 Basal cell carcinoma of skin of nose: Secondary | ICD-10-CM | POA: Diagnosis not present

## 2022-11-22 DIAGNOSIS — L820 Inflamed seborrheic keratosis: Secondary | ICD-10-CM | POA: Diagnosis not present

## 2022-11-22 DIAGNOSIS — Z85828 Personal history of other malignant neoplasm of skin: Secondary | ICD-10-CM | POA: Diagnosis not present

## 2022-12-07 DIAGNOSIS — Z85828 Personal history of other malignant neoplasm of skin: Secondary | ICD-10-CM | POA: Diagnosis not present

## 2022-12-07 DIAGNOSIS — C44311 Basal cell carcinoma of skin of nose: Secondary | ICD-10-CM | POA: Diagnosis not present

## 2022-12-11 ENCOUNTER — Other Ambulatory Visit: Payer: Self-pay | Admitting: Nurse Practitioner

## 2022-12-21 ENCOUNTER — Other Ambulatory Visit: Payer: Self-pay

## 2022-12-21 MED ORDER — AMLODIPINE BESYLATE 10 MG PO TABS: ORAL_TABLET | ORAL | 2 refills | Status: DC

## 2022-12-23 DIAGNOSIS — I10 Essential (primary) hypertension: Secondary | ICD-10-CM

## 2022-12-23 MED ORDER — VALSARTAN 80 MG PO TABS: 80.0000 mg | ORAL_TABLET | Freq: Every day | ORAL | 3 refills | Status: DC

## 2022-12-31 ENCOUNTER — Telehealth: Payer: Self-pay | Admitting: Nurse Practitioner

## 2022-12-31 NOTE — Telephone Encounter (Signed)
Patient son dropped off personal letter for patient

## 2023-01-03 NOTE — Telephone Encounter (Signed)
Gaston Islam., NP  Rob Hickman minutes ago (8:57 AM)    Please let Ms. Deborah Logan know that I received her letter and I understand regarding her current circumstances.  We can cancel her most recent lab appointment and advised her to continue to check her blood pressures.  Please contact us if she has any further questions.  Robin Searing, NP

## 2023-01-03 NOTE — Telephone Encounter (Signed)
Spoke with the patient and made her aware of Alden Server recommendations. The patient states she is very thankful for Alden Server being so understanding.

## 2023-01-06 ENCOUNTER — Ambulatory Visit: Payer: Medicare Other

## 2023-01-31 NOTE — Progress Notes (Unsigned)
  Cardiology Office Note:  .   Date:  02/01/2023  ID:  Deborah Logan, DOB Mar 26, 1946, MRN [id number] PCP: Aviva Kluver  [facility] Providers Cardiologist:  Kristeen Miss, MD    History of Present Illness: .   Deborah Logan is a 77 y.o. female with hx of HTN, HLD, obesity  CKD stage IIIb I met her during a hospitalization in Nov. 2023 when she was admitted for chest pain  She had missed several doses of her BP meds   Her husband, Deborah Logan died in 2022/10/10.  BP was elevated D-dimer was elevated, CTA of lungs was negative for PE   Lexiscan myoview was discussed She wanted to wait and discuss options at a later time   She was seen by Robin Searing, NP in Nov. 27, 2023  Complains that the Coreg is causing some dizziness,  Echocardiogram from November, 2023 reveals normal left ventricular systolic function with EF of 50 to 60%.  She has grade 1 diastolic dysfunction.  Right ventricle is normal size and function. Trivial mitral regurgitation. Borderline dilatation of the aorta measuring 39 mm.     ROS:   Studies Reviewed: .         Risk Assessment/Calculations:             Physical Exam:   VS:  BP 138/78   Pulse 70   Ht 5\' 5"  (1.651 m)   Wt 189 lb (85.7 kg)   SpO2 98%   BMI 31.45 kg/m    Wt Readings from Last 3 Encounters:  02/01/23 189 lb (85.7 kg)  08/11/22 187 lb 3.2 oz (84.9 kg)  06/28/22 194 lb (88 kg)    GEN: Well nourished, well developed in no acute distress NECK: No JVD; No carotid bruits CARDIAC: RRR, no murmurs, rubs, gallops RESPIRATORY:  Clear to auscultation without rales, wheezing or rhonchi  ABDOMEN: Soft, non-tender, non-distended EXTREMITIES:  No edema; No deformity   ASSESSMENT AND PLAN: .   Hypertension: Alinda Money presents for further evaluation and management of her hypertension.  She states that the higher dose carvedilol is causing some fatigue and generalized lack of energy.  Will reduce the carvedilol down to 25 mg twice a day.  Increase the  valsartan to 160 mg a day.  Will check a basic metabolic profile in 2 to 3 weeks.  The sternal work on diet, exercise, weight loss.  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  I recommended that she try riding a stationary bike.  Her husband and son are cyclist and will help her set up the bike.       Dispo: 3 months     Signed, Kristeen Miss, MD

## 2023-02-01 ENCOUNTER — Encounter: Payer: Self-pay | Admitting: Cardiovascular Disease

## 2023-02-01 ENCOUNTER — Ambulatory Visit: Payer: Medicare Other | Attending: Cardiovascular Disease | Admitting: Cardiovascular Disease

## 2023-02-01 VITALS — BP 138/78 | HR 70 | Ht 65.0 in | Wt 189.0 lb

## 2023-02-01 DIAGNOSIS — N1832 Chronic kidney disease, stage 3b: Secondary | ICD-10-CM | POA: Insufficient documentation

## 2023-02-01 DIAGNOSIS — I10 Essential (primary) hypertension: Secondary | ICD-10-CM | POA: Diagnosis not present

## 2023-02-01 MED ORDER — CARVEDILOL 25 MG PO TABS: 25.0000 mg | ORAL_TABLET | Freq: Two times a day (BID) | ORAL | 3 refills | Status: DC

## 2023-02-01 MED ORDER — VALSARTAN 160 MG PO TABS: 160.0000 mg | ORAL_TABLET | Freq: Every day | ORAL | 3 refills | Status: DC

## 2023-02-01 NOTE — Patient Instructions (Signed)
Medication Instructions:  DECREASE Carvedilol to 25 mg twice daily  INCREASE Valsartan to 160 mg once daily  *If you need a refill on your cardiac medications before your next appointment, please call your pharmacy*   Lab Work: BMET in 2-3 weeks If you have labs (blood work) drawn today and your tests are completely normal, you will receive your results only by: MyChart Message (if you have MyChart) OR A paper copy in the mail If you have any lab test that is abnormal or we need to change your treatment, we will call you to review the results.   Follow-Up: At [facility], you and your health needs are our priority.  As part of our continuing mission to provide you with exceptional heart care, we have created designated Provider Care Teams.  These Care Teams include your primary Cardiologist (physician) and Advanced Practice Providers (APPs -  Physician Assistants and Nurse Practitioners) who all work together to provide you with the care you need, when you need it.  We recommend signing up for the patient portal called "MyChart".  Sign up information is provided on this After Visit Summary.  MyChart is used to connect with patients for Virtual Visits (Telemedicine).  Patients are able to view lab/test results, encounter notes, upcoming appointments, etc.  Non-urgent messages can be sent to your provider as well.   To learn more about what you can do with MyChart, go to [email or web address].    Your next appointment:   3 month(s)  Provider:   Kristeen Miss, MD

## 2023-02-08 DIAGNOSIS — R2689 Other abnormalities of gait and mobility: Secondary | ICD-10-CM | POA: Diagnosis not present

## 2023-02-08 DIAGNOSIS — N183 Chronic kidney disease, stage 3 unspecified: Secondary | ICD-10-CM | POA: Diagnosis not present

## 2023-02-08 DIAGNOSIS — I129 Hypertensive chronic kidney disease with stage 1 through stage 4 chronic kidney disease, or unspecified chronic kidney disease: Secondary | ICD-10-CM | POA: Diagnosis not present

## 2023-02-08 DIAGNOSIS — E669 Obesity, unspecified: Secondary | ICD-10-CM | POA: Diagnosis not present

## 2023-02-08 DIAGNOSIS — E7800 Pure hypercholesterolemia, unspecified: Secondary | ICD-10-CM | POA: Diagnosis not present

## 2023-02-08 DIAGNOSIS — M109 Gout, unspecified: Secondary | ICD-10-CM | POA: Diagnosis not present

## 2023-02-08 DIAGNOSIS — R7303 Prediabetes: Secondary | ICD-10-CM | POA: Diagnosis not present

## 2023-02-11 ENCOUNTER — Encounter: Payer: Self-pay | Admitting: Cardiovascular Disease

## 2023-02-17 ENCOUNTER — Ambulatory Visit: Payer: Medicare Other

## 2023-04-14 DIAGNOSIS — Z23 Encounter for immunization: Secondary | ICD-10-CM | POA: Diagnosis not present

## 2023-05-05 ENCOUNTER — Encounter: Payer: Self-pay | Admitting: Cardiovascular Disease

## 2023-05-05 NOTE — Progress Notes (Addendum)
Cardiology Office Note:  .   Date:  05/06/2023  ID:  Deborah Logan, DOB June 09, 1946, MRN [id number] PCP: Deborah Logan  [facility] Providers Cardiologist:  Kristeen Miss, MD    History of Present Illness: .   Deborah Logan is a 77 y.o. female with hx of HTN, HLD, obesity  CKD stage IIIb I met her during a hospitalization in Nov. 2023 when she was admitted for chest pain  She had missed several doses of her BP meds   Her husband, [name] died in 11/05/2022.  BP was elevated D-dimer was elevated, CTA of lungs was negative for PE   Lexiscan myoview was discussed She wanted to wait and discuss options at a later time   She was seen by Robin Searing, NP in Nov. 27, 2023  Complains that the Coreg is causing some dizziness,  Echocardiogram from November, 2023 reveals normal left ventricular systolic function with EF of 50 to 60%.  She has grade 1 diastolic dysfunction.  Right ventricle is normal size and function. Trivial mitral regurgitation. Borderline dilatation of the aorta measuring 39 mm.  Oct. 4, 2024 Deborah Logan is seen for follow up of her chronic diastolic dysfunction Episodic CP   She has frequent frequent premature ventricular contractions.  Her primary medical doctor thought that her heart rate was slow so her carvedilol was reduced.  Turns out that she is having frequent premature ventricular contractions and her heart rate is 89.  Blood pressure is is on the high side. Has had some dizziness   Has generalized weakness in the morning after she takes her AM medication ( takes amlodipine 10 mg and Coreg 12.5 in the AM )    ROS:   Studies Reviewed: .         Risk Assessment/Calculations:           Physical Exam:   VS:  BP (!) 156/86   Pulse 89   Ht 5\' 4"  (1.626 m)   Wt 186 lb 9.6 oz (84.6 kg)   SpO2 98%   BMI 32.03 kg/m    Wt Readings from Last 3 Encounters:  05/06/23 186 lb 9.6 oz (84.6 kg)  02/01/23 189 lb (85.7 kg)  08/11/22 187 lb 3.2 oz (84.9 kg)    GEN: Well  nourished, well developed in no acute distress NECK: No JVD; No carotid bruits CARDIAC: RRR, no murmurs, rubs, gallops RESPIRATORY:  Clear to auscultation without rales, wheezing or rhonchi  ABDOMEN: Soft, non-tender, non-distended EXTREMITIES:  No edema; No deformity   ECG:        ASSESSMENT AND PLAN: .      HTN:   BP remains mildly elevated but some of this elevation appears to be due to her frequent premature ventricular contractions.     We will change her coreg to 6.25 BID  Hopefully her BP will stabilize if we can get her PVCs better controlled.   If she is tolerating the flecainide, I have I would like to potentially add a low dose diuretic ( assuming her renal function is stable )  Her last creatinine was 1.5. It may be difficult to increase her Valsartan - will need to keep a close eye on her renal function    I have encouraged her to walk regularly   2.  Frequent premature ventricular contractions: Echocardiogram from November, 2023 reveals normal left ventricular systolic function. Will try flecainide 50 mg twice a day.  EKG when she sees [place] in 4 weeks.  Signed, Kristeen Miss, MD

## 2023-05-06 ENCOUNTER — Other Ambulatory Visit: Payer: Self-pay

## 2023-05-06 ENCOUNTER — Encounter: Payer: Self-pay | Admitting: Cardiovascular Disease

## 2023-05-06 ENCOUNTER — Ambulatory Visit: Payer: Medicare Other | Attending: Cardiovascular Disease | Admitting: Cardiovascular Disease

## 2023-05-06 VITALS — BP 156/86 | HR 89 | Ht 64.0 in | Wt 186.6 lb

## 2023-05-06 DIAGNOSIS — Z79899 Other long term (current) drug therapy: Secondary | ICD-10-CM | POA: Insufficient documentation

## 2023-05-06 DIAGNOSIS — I493 Ventricular premature depolarization: Secondary | ICD-10-CM | POA: Diagnosis not present

## 2023-05-06 DIAGNOSIS — I10 Essential (primary) hypertension: Secondary | ICD-10-CM | POA: Diagnosis not present

## 2023-05-06 MED ORDER — CARVEDILOL 6.25 MG PO TABS: 6.2500 mg | ORAL_TABLET | Freq: Two times a day (BID) | ORAL | 3 refills | Status: DC

## 2023-05-06 MED ORDER — FLECAINIDE ACETATE 50 MG PO TABS: 50.0000 mg | ORAL_TABLET | Freq: Two times a day (BID) | ORAL | 3 refills | Status: DC

## 2023-05-06 MED ORDER — VALSARTAN 160 MG PO TABS: 160.0000 mg | ORAL_TABLET | Freq: Every day | ORAL | 3 refills | Status: DC

## 2023-05-06 NOTE — Patient Instructions (Signed)
Medication Instructions:  START Carvedilol/Coreg 6.25mg  twice daily START Flecainide 50mg  twice daily *If you need a refill on your cardiac medications before your next appointment, please call your pharmacy*   Lab Work: BMET today If you have labs (blood work) drawn today and your tests are completely normal, you will receive your results only by: MyChart Message (if you have MyChart) OR A paper copy in the mail If you have any lab test that is abnormal or we need to change your treatment, we will call you to review the results.   Testing/Procedures: NONE   Follow-Up: At [facility], you and your health needs are our priority.  As part of our continuing mission to provide you with exceptional heart care, we have created designated Provider Care Teams.  These Care Teams include your primary Cardiologist (physician) and Advanced Practice Providers (APPs -  Physician Assistants and Nurse Practitioners) who all work together to provide you with the care you need, when you need it.  We recommend signing up for the patient portal called "MyChart".  Sign up information is provided on this After Visit Summary.  MyChart is used to connect with patients for Virtual Visits (Telemedicine).  Patients are able to view lab/test results, encounter notes, upcoming appointments, etc.  Non-urgent messages can be sent to your provider as well.   To learn more about what you can do with MyChart, go to [email or web address].    Your next appointment:   4 week(s)  Provider:   Eligha Bridegroom, NP

## 2023-05-07 LAB — BASIC METABOLIC PANEL
BUN/Creatinine Ratio: 21 (ref 12–28)
BUN: 26 mg/dL (ref 8–27)
CO2: 21 mmol/L (ref 20–29)
Calcium: 9.7 mg/dL (ref 8.7–10.3)
Chloride: 101 mmol/L (ref 96–106)
Creatinine, Ser: 1.21 mg/dL — ABNORMAL HIGH (ref 0.57–1.00)
Glucose: 111 mg/dL — ABNORMAL HIGH (ref 70–99)
Potassium: 4.8 mmol/L (ref 3.5–5.2)
Sodium: 138 mmol/L (ref 134–144)
eGFR: 46 mL/min/{1.73_m2} — ABNORMAL LOW (ref >59)

## 2023-05-16 ENCOUNTER — Encounter: Payer: Self-pay | Admitting: Cardiovascular Disease

## 2023-05-17 NOTE — Telephone Encounter (Signed)
Called and spoke with patient. She has not been routinely checking BP. States that AM dizziness/lightheadedness has not changed any at all. Doesn't check BP until later in the day, so is unsure what BP is at time of these spells. Questioned her intake at breakfast and she states she almost always eats a small amount of Oatmeal with raisins and wheat germ added. Advised that she really should seek out protein and fluids to help combat this dizziness. She understands now. Questioned what she ate last night for dinner since she's reporting BP at 192/77 at time of call, she states she had yogurt with some cereal and hasn't eaten yet this morning. She feels her BP is much higher since decreasing her AM dose of Carvedilol to 6.25mg  (previously 12.5). Advised her that she may need to move the Valsartan to AM, but with her current dizziness and unknown pressures in the morning, I hesitate as it could drop her too much. She will work on heavier intake of food in the AM and fluids and for the next 3 days will take 12.5mg  Coreg in the morning along with her standard Amlodipine 10mg  and continue her PM dose of 6.25mg  only, along with her Valsartan. She will check BP more diligently and contact me back in 3 days to let me know how she's doing on it. Denies any symptoms associated with HTN at this time.

## 2023-05-18 ENCOUNTER — Encounter: Payer: Self-pay | Admitting: Cardiovascular Disease

## 2023-05-18 DIAGNOSIS — I10 Essential (primary) hypertension: Secondary | ICD-10-CM

## 2023-05-18 MED ORDER — VALSARTAN 320 MG PO TABS: 320.0000 mg | ORAL_TABLET | Freq: Every day | ORAL | 3 refills | Status: DC

## 2023-05-18 NOTE — Telephone Encounter (Signed)
Called pt advised of Pharmacist response: Supple, Megan E, RPH-CPP  Henri Baumler N, RN Can increase valsartan to 320mg  daily. Would have pt follow up with PharmD HTN clinic for further management in a few weeks (will need BMET checked). Pt should bring in home BP cuff  to that visit so its accuracy can be confirmed, along with home BP readings. Depending on potassium and BP at that time, may need addition of thiazide or MRA for additional BP control. Agree cannot increase beta blocker dose or have pt take extra given bradycardia. Elevated blood pressure is not a common side effect of flecainide.  Pt reports has an OV with Sw[place], NP on 06/06/23 would like to know if she can just come for this appointment instead of pharmacy clinic or if both appointments are needed.  Advised I will send to pharmacist to answer, however I think 06/06/23 f/u may be sufficient.

## 2023-05-18 NOTE — Telephone Encounter (Signed)
Called pt in regards to my chart message.  Reports noted elevated SBP readings starting on 10/11 150 HR 62, 10/13- 167 10/14- 180 Today 190/76-38 at 10:30 am repeat during phone call 186/87-57.  Pt reports feels that BP has elevated since the addition of Flecainide at 05/06/23 OV with MD.  Wants to know what to take to lower BP.    Denies dizziness/light-headedness.  Took an extra dose of carvedilol 6.25 mg yesterday during the day time and this AM.  Reviewed meds takes amlodipine 10 mg every day, valsartan 160 mg every day, flecainide 50 mg BID and carvedilol 6.25 mg.   Advised pt not to take anymore extra doses of metoprolol d/t low HR.  Expresses understanding.  Advised if begins to have HA, dizziness, to call 911 for evaluation. Will send to pharmacist to advise.

## 2023-05-19 ENCOUNTER — Telehealth: Payer: Self-pay | Admitting: Cardiovascular Disease

## 2023-05-19 MED ORDER — CHLORTHALIDONE 25 MG PO TABS: 25.0000 mg | ORAL_TABLET | Freq: Every day | ORAL | 3 refills | Status: DC

## 2023-05-19 NOTE — Telephone Encounter (Signed)
This has been addressed in a phone call encounter.  Please see that encounter for complete details.

## 2023-05-19 NOTE — Telephone Encounter (Signed)
Pt c/o BP issue: STAT if pt c/o blurred vision, one-sided weakness or slurred speech  1. What are your last 5 BP readings? See below  2. Are you having any other symptoms (ex. Dizziness, headache, blurred vision, passed out)? No   3. What is your BP issue?   Patient's MyChart message from today:   "Good morning Deborah Logan, I am a patient of Dr Elease Hashimoto. I have an appt with you on Nov 4 which is a follow up to my appt with him on Oct 4. My blood pressure is very high today, as it was yesterday. After talking with staff yesterday, I was advised to take 320 mg of valsartan instead of 160mg  which I take in the evening. I did so last night. My reading before my meds this morning was 200/88-80. I took my meds this morning and waited two hours and took another reading. It read 200/80-62. I know Dr Elease Hashimoto is out of the office this week, but I am hopeful I can get a med to lower this blood pressure. Can you please help me?"

## 2023-05-19 NOTE — Telephone Encounter (Signed)
Spoke with pt who reports she was advised yesterday to increase Valsartan to 320mg  per [facility].  Pt states she did this last night and BP this morning remained at 200/80 with HR-62, 2 hours after medications.  RN requested pt recheck BP while on the phone and pt received a reading of 185/72 HR-68.  Pt denies current CP, SOB, dizziness or edema.   Pt states she has seen this increase in her systolic pressure since starting Flecainide and wonders if this medication can raise BP.  Pt advised to rest today, continue on her low sodium diet, take medications as currently prescribed and monitor BP.  Will forward to PharmD for further advisement.  Pt verbalizes understanding and agrees with current plan.

## 2023-05-19 NOTE — Telephone Encounter (Signed)
It will take more than 1 dose of the valsartan 320mg  to see improvement, but her BP is still very high. Ok to start chlorthalidone 25mg  1 tablet once daily as well, BMET will be checked at f/u appt with Marcelino Duster.

## 2023-05-19 NOTE — Telephone Encounter (Signed)
`  Spoke with pt and advised of RPH recommendation as below.  Pt verbalizes understanding and agrees with current plan.

## 2023-05-20 ENCOUNTER — Telehealth: Payer: Self-pay | Admitting: Cardiovascular Disease

## 2023-05-20 DIAGNOSIS — I10 Essential (primary) hypertension: Secondary | ICD-10-CM

## 2023-05-20 NOTE — Telephone Encounter (Signed)
Completed by Mindi Junker, RN 05/19/23

## 2023-05-20 NOTE — Telephone Encounter (Signed)
Pt states she would like to speak with a nurse. Following up off the 10/17 phone note. Please advise

## 2023-05-22 ENCOUNTER — Encounter: Payer: Self-pay | Admitting: Cardiovascular Disease

## 2023-05-23 NOTE — Telephone Encounter (Signed)
Duplicate message. Completed in separate MyChart encounter and sent to MD.

## 2023-05-23 NOTE — Telephone Encounter (Signed)
Patient is following up requesting advisement regarding patient message.

## 2023-05-24 ENCOUNTER — Ambulatory Visit (INDEPENDENT_AMBULATORY_CARE_PROVIDER_SITE_OTHER): Payer: Medicare Other | Admitting: Pharmacist Clinician (PhC)/ Clinical Pharmacy Specialist

## 2023-05-24 VITALS — BP 162/62 | HR 48

## 2023-05-24 DIAGNOSIS — I10 Essential (primary) hypertension: Secondary | ICD-10-CM

## 2023-05-24 NOTE — Patient Instructions (Signed)
Follow up appointment: with Deborah Logan on Nov 4  Go to the lab in 1 week to check kidney function  Take your BP meds as follows:  Continue with your current medications  Check your blood pressure at home daily (if able) and keep record of the readings.  Hypertension "High blood pressure"  Hypertension is often called "The Silent Killer." It rarely causes symptoms until it is extremely  high or has done damage to other organs in the body. For this reason, you should have your  blood pressure checked regularly by your physician. We will check your blood pressure  every time you see a provider at one of our offices.   Your blood pressure reading consists of two numbers. Ideally, blood pressure should be  below 120/80. The first ("top") number is called the systolic pressure. It measures the  pressure in your arteries as your heart beats. The second ("bottom") number is called the diastolic pressure. It measures the pressure in your arteries as the heart relaxes between beats.  The benefits of getting your blood pressure under control are enormous. A 10-point  reduction in systolic blood pressure can reduce your risk of stroke by 27% and heart failure by 28%  Your blood pressure goal is < 130/80  To check your pressure at home you will need to:  1. Sit up in a chair, with feet flat on the floor and back supported. Do not cross your ankles or legs. 2. Rest your left arm so that the cuff is about heart level. If the cuff goes on your upper arm,  then just relax the arm on the table, arm of the chair or your lap. If you have a wrist cuff, we  suggest relaxing your wrist against your chest (think of it as Pledging the Flag with the  wrong arm).  3. Place the cuff snugly around your arm, about 1 inch above the crook of your elbow. The  cords should be inside the groove of your elbow.  4. Sit quietly, with the cuff in place, for about 5 minutes. After that 5 minutes press the power   button to start a reading. 5. Do not talk or move while the reading is taking place.  6. Record your readings on a sheet of paper. Although most cuffs have a memory, it is often  easier to see a pattern developing when the numbers are all in front of you.  7. You can repeat the reading after 1-3 minutes if it is recommended  Make sure your bladder is empty and you have not had caffeine or tobacco within the last 30 min  Always bring your blood pressure log with you to your appointments. If you have not brought your monitor in to be double checked for accuracy, please bring it to your next appointment.  You can find a list of quality blood pressure cuffs at validatebp.org

## 2023-05-24 NOTE — Progress Notes (Unsigned)
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 female patient of Dr Elease Hashimoto, in the office today for hypertension management.  She was recently seen by Dr. Elease Hashimoto (Oct 4), with a pressure of 156/86.  She was having PVC's, that he believed were contributing to her higher blood pressure, and started her on flecainide 50 mg twice daily.  She was then scheduled to follow up with NP visit in early November.   That appointment still is on the schedule, but patient sent a MyChart message about a week later noting elevated BP readings at home, believing that the carvedilol was raising her pressure.  She also noted dizziness/lightheadedness.  Over the next 10 days there were multiple messages that went back and forth, and in that time she was started on both valsartan 320 and chlorthalidone 25, in addition the the amlodipine and carvedilol.  The rapid changes were causing some confusion, so we worked her into the PharmD schedule at the [place] office today.     She is a little overwhelmed at the recent changes, and notes that since her husband died earlier this year, she is still trying to find her new normal.    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  (Range 128-201/67-92)      Accessory Clinical Findings    Lab Results  Component Value Date   CREATININE 1.21 (H) 05/06/2023   BUN 26 05/06/2023   NA 138 05/06/2023   K 4.8 05/06/2023   CL 101 05/06/2023   CO2 21 05/06/2023   Lab Results  Component Value Date   ALT 16 06/13/2022   AST 18 06/13/2022   ALKPHOS 77 06/13/2022   BILITOT 0.4 06/13/2022   Lab Results  Component Value Date   HGBA1C 6.1 (H) 06/13/2022    Home Medications    Current Outpatient Medications  Medication Sig Dispense Refill   allopurinol (ZYLOPRIM) 100 MG tablet Take 100 mg by mouth daily.     amLODipine (NORVASC) 10 MG tablet TAKE 1 TABLET(10 MG) BY MOUTH DAILY 90 tablet 2   carvedilol (COREG) 6.25 MG tablet Take 1 tablet (6.25 mg total) by mouth 2 (two) times daily. 180 tablet 3  chlorthalidone (HYGROTON) 25 MG tablet Take 1 tablet (25 mg total) by mouth daily. 90 tablet 3   flecainide (TAMBOCOR) 50 MG tablet Take 1 tablet (50 mg total) by mouth 2 (two) times daily. 180 tablet 3   rosuvastatin (CRESTOR) 5 MG tablet Take 5 mg by mouth daily.     valsartan (DIOVAN) 320 MG tablet Take 1 tablet (320 mg total) by mouth daily. 90 tablet 3   vitamin D3 (CHOLECALCIFEROL) 25 MCG tablet Take 1,000 Units by mouth daily.     No current facility-administered medications for this visit.     Assessment & Plan    HTN (hypertension) Assessment: BP is uncontrolled in office BP 161/62 mmHg;  above the goal (<130/80). Pt recently diagnosed with PVC's, which can increase BP 2/2 sympathetic overstimulation  Tolerates amlodipine and  carvedilol well without any side effects Was started on valsartan 320 and chlorthalidone 25 both in the last week Denies SOB, palpitation, chest pain, headaches,or swelling Had a long discussion about BP and how the PVC's can affect this.  Also reviewed that medications often need up to a week before we begin to see substantial changes in BP.   Reiterated the importance of regular exercise and low salt diet   Plan:  Continue taking amlodipine 10 mg every day (am), carvedilol 6.25 mg bid, chlorthalidone 25 mg every day (am), valsartan 320 mg every day (pm) Patient to keep record of BP readings with heart rate and report to [place] at the next visit Stop drinking Gatorade and replace with plain water.   Get back to regular yoga classes as soon as possible.  Patient to follow up with me in November  Labs ordered today:  BMET in 1 week   Phillips Hay PharmD CPP [facility]  [address] [place], [place] [address] [phone]

## 2023-05-25 ENCOUNTER — Encounter (HOSPITAL_BASED_OUTPATIENT_CLINIC_OR_DEPARTMENT_OTHER): Payer: Self-pay | Admitting: Pharmacist Clinician (PhC)/ Clinical Pharmacy Specialist

## 2023-05-25 NOTE — Assessment & Plan Note (Signed)
Assessment: BP is uncontrolled in office BP 161/62 mmHg;  above the goal (<130/80). Pt recently diagnosed with PVC's, which can increase BP 2/2 sympathetic overstimulation  Tolerates amlodipine and carvedilol well without any side effects Was started on valsartan 320 and chlorthalidone 25 both in the last week Denies SOB, palpitation, chest pain, headaches,or swelling Had a long discussion about BP and how the PVC's can affect this.  Also reviewed that medications often need up to a week before we begin to see substantial changes in BP.   Reiterated the importance of regular exercise and low salt diet   Plan:  Continue taking amlodipine 10 mg every day (am), carvedilol 6.25 mg bid, chlorthalidone 25 mg every day (am), valsartan 320 mg every day (pm) Patient to keep record of BP readings with heart rate and report to [place] at the next visit Stop drinking Gatorade and replace with plain water.   Get back to regular yoga classes as soon as possible.  Patient to follow up with me in November  Labs ordered today:  BMET in 1 week

## 2023-05-30 ENCOUNTER — Encounter: Payer: Self-pay | Admitting: Cardiovascular Disease

## 2023-05-31 ENCOUNTER — Other Ambulatory Visit: Payer: Self-pay | Admitting: Cardiovascular Disease

## 2023-05-31 DIAGNOSIS — I10 Essential (primary) hypertension: Secondary | ICD-10-CM | POA: Diagnosis not present

## 2023-05-31 NOTE — Telephone Encounter (Signed)
If pt would like to try to change, stop Carvedilol and start Metoprolol succinate 50 mg once daily.  Tereso Newcomer, PA-C    05/31/2023 5:07 PM

## 2023-06-01 ENCOUNTER — Encounter: Payer: Self-pay | Admitting: Cardiovascular Disease

## 2023-06-01 ENCOUNTER — Encounter (HOSPITAL_BASED_OUTPATIENT_CLINIC_OR_DEPARTMENT_OTHER): Payer: Self-pay | Admitting: Pharmacist Clinician (PhC)/ Clinical Pharmacy Specialist

## 2023-06-01 LAB — BASIC METABOLIC PANEL
BUN/Creatinine Ratio: 43 — ABNORMAL HIGH (ref 12–28)
BUN: 65 mg/dL — ABNORMAL HIGH (ref 8–27)
CO2: 17 mmol/L — ABNORMAL LOW (ref 20–29)
Calcium: 9.3 mg/dL (ref 8.7–10.3)
Chloride: 97 mmol/L (ref 96–106)
Creatinine, Ser: 1.51 mg/dL — ABNORMAL HIGH (ref 0.57–1.00)
Glucose: 142 mg/dL — ABNORMAL HIGH (ref 70–99)
Potassium: 4.9 mmol/L (ref 3.5–5.2)
Sodium: 129 mmol/L — ABNORMAL LOW (ref 134–144)
eGFR: 35 mL/min/{1.73_m2} — ABNORMAL LOW (ref >59)

## 2023-06-01 MED ORDER — METOPROLOL SUCCINATE ER 50 MG PO TB24: 50.0000 mg | ORAL_TABLET | Freq: Every day | ORAL | 3 refills | Status: DC

## 2023-06-02 ENCOUNTER — Ambulatory Visit: Payer: Medicare Other

## 2023-06-02 ENCOUNTER — Encounter (HOSPITAL_BASED_OUTPATIENT_CLINIC_OR_DEPARTMENT_OTHER): Payer: Self-pay | Admitting: Pharmacist Clinician (PhC)/ Clinical Pharmacy Specialist

## 2023-06-06 ENCOUNTER — Encounter: Payer: Self-pay | Admitting: Nurse Practitioner

## 2023-06-06 ENCOUNTER — Ambulatory Visit: Payer: Medicare Other | Attending: Nurse Practitioner | Admitting: Nurse Practitioner

## 2023-06-06 VITALS — BP 150/66 | HR 92 | Ht 64.0 in | Wt 181.0 lb

## 2023-06-06 DIAGNOSIS — I493 Ventricular premature depolarization: Secondary | ICD-10-CM | POA: Insufficient documentation

## 2023-06-06 DIAGNOSIS — E871 Hypo-osmolality and hyponatremia: Secondary | ICD-10-CM | POA: Insufficient documentation

## 2023-06-06 DIAGNOSIS — I10 Essential (primary) hypertension: Secondary | ICD-10-CM | POA: Diagnosis not present

## 2023-06-06 DIAGNOSIS — N1832 Chronic kidney disease, stage 3b: Secondary | ICD-10-CM | POA: Diagnosis not present

## 2023-06-06 DIAGNOSIS — I1A0 Resistant hypertension: Secondary | ICD-10-CM | POA: Insufficient documentation

## 2023-06-06 DIAGNOSIS — Z79899 Other long term (current) drug therapy: Secondary | ICD-10-CM | POA: Diagnosis not present

## 2023-06-06 MED ORDER — METOPROLOL SUCCINATE ER 50 MG PO TB24: 25.0000 mg | ORAL_TABLET | Freq: Every day | ORAL | Status: DC

## 2023-06-06 MED ORDER — CHLORTHALIDONE 25 MG PO TABS: 12.5000 mg | ORAL_TABLET | Freq: Every day | ORAL | Status: DC

## 2023-06-06 NOTE — Patient Instructions (Signed)
Medication Instructions:   DECREASE Toprol one half (0.5) tablet by mouth ( 25 mg ) daily.   *If you need a refill on your cardiac medications before your next appointment, please call your pharmacy*   Lab Work:  TODAY!!! BMET  If you have labs (blood work) drawn today and your tests are completely normal, you will receive your results only by: MyChart Message (if you have MyChart) OR A paper copy in the mail If you have any lab test that is abnormal or we need to change your treatment, we will call you to review the results.   Testing/Procedures:  None ordered.    Follow-Up: At [facility], you and your health needs are our priority.  As part of our continuing mission to provide you with exceptional heart care, we have created designated Provider Care Teams.  These Care Teams include your primary Cardiologist (physician) and Advanced Practice Providers (APPs -  Physician Assistants and Nurse Practitioners) who all work together to provide you with the care you need, when you need it.  We recommend signing up for the patient portal called "MyChart".  Sign up information is provided on this After Visit Summary.  MyChart is used to connect with patients for Virtual Visits (Telemedicine).  Patients are able to view lab/test results, encounter notes, upcoming appointments, etc.  Non-urgent messages can be sent to your provider as well.   To learn more about what you can do with MyChart, go to [email or web address].    Your next appointment:   6 month(s)  Provider:   Eligha Bridegroom, NP         Other Instructions  Your physician wants you to follow-up in: 6 months.  You will receive a reminder letter in the mail two months in advance. If you don't receive a letter, please call our office to schedule the follow-up appointment.

## 2023-06-07 ENCOUNTER — Other Ambulatory Visit: Payer: Self-pay | Admitting: *Deleted

## 2023-06-07 LAB — BASIC METABOLIC PANEL
BUN/Creatinine Ratio: 34 — ABNORMAL HIGH (ref 12–28)
BUN: 47 mg/dL — ABNORMAL HIGH (ref 8–27)
CO2: 19 mmol/L — ABNORMAL LOW (ref 20–29)
Calcium: 10 mg/dL (ref 8.7–10.3)
Chloride: 102 mmol/L (ref 96–106)
Creatinine, Ser: 1.4 mg/dL — ABNORMAL HIGH (ref 0.57–1.00)
Glucose: 173 mg/dL — ABNORMAL HIGH (ref 70–99)
Potassium: 5.2 mmol/L (ref 3.5–5.2)
Sodium: 136 mmol/L (ref 134–144)
eGFR: 39 mL/min/{1.73_m2} — ABNORMAL LOW (ref >59)

## 2023-06-07 MED ORDER — HYDRALAZINE HCL 25 MG PO TABS: 25.0000 mg | ORAL_TABLET | Freq: Three times a day (TID) | ORAL | 3 refills | Status: DC

## 2023-06-15 ENCOUNTER — Encounter (HOSPITAL_BASED_OUTPATIENT_CLINIC_OR_DEPARTMENT_OTHER): Payer: Self-pay | Admitting: Pharmacist Clinician (PhC)/ Clinical Pharmacy Specialist

## 2023-06-16 ENCOUNTER — Telehealth: Payer: Self-pay | Admitting: Cardiovascular Disease

## 2023-06-16 NOTE — Telephone Encounter (Signed)
Pt has sent mychart message to Pharmacist but looks like she is not in the office so pt would like a nurse to call her back.

## 2023-06-16 NOTE — Telephone Encounter (Signed)
Patient sees Dr Elease Hashimoto at [facility], will forward to triage

## 2023-06-20 NOTE — Telephone Encounter (Signed)
Completed in MyChart encounter by PharmD.

## 2023-06-21 ENCOUNTER — Encounter (HOSPITAL_BASED_OUTPATIENT_CLINIC_OR_DEPARTMENT_OTHER): Payer: Self-pay | Admitting: Pharmacist Clinician (PhC)/ Clinical Pharmacy Specialist

## 2023-06-22 ENCOUNTER — Telehealth: Payer: Self-pay | Admitting: Pharmacist Clinician (PhC)/ Clinical Pharmacy Specialist

## 2023-06-22 DIAGNOSIS — I10 Essential (primary) hypertension: Secondary | ICD-10-CM

## 2023-06-22 NOTE — Telephone Encounter (Signed)
Deborah Logan  Dr. Elease Hashimoto is out today and tomorrow.  Would you look at this note and tell me what you think?  Her SCr has definitely jumped since the first of October, but I don't quite know what to do with her symptoms.   She says HR is in the 30's, currently on metoprolol succ 25.  Her last visit HR was 92, but she had missed doses of metoprolol for a couple of days.     thanks

## 2023-06-22 NOTE — Telephone Encounter (Signed)
Bmet ordered per patient request.  Advised to d/c metoprolol for now 2/2 fatigue and SOB

## 2023-06-23 DIAGNOSIS — I10 Essential (primary) hypertension: Secondary | ICD-10-CM | POA: Diagnosis not present

## 2023-06-24 ENCOUNTER — Encounter (HOSPITAL_BASED_OUTPATIENT_CLINIC_OR_DEPARTMENT_OTHER): Payer: Self-pay | Admitting: Pharmacist Clinician (PhC)/ Clinical Pharmacy Specialist

## 2023-06-24 LAB — BASIC METABOLIC PANEL
BUN/Creatinine Ratio: 25 (ref 12–28)
BUN: 30 mg/dL — ABNORMAL HIGH (ref 8–27)
CO2: 17 mmol/L — ABNORMAL LOW (ref 20–29)
Calcium: 8.9 mg/dL (ref 8.7–10.3)
Chloride: 90 mmol/L — ABNORMAL LOW (ref 96–106)
Creatinine, Ser: 1.2 mg/dL — ABNORMAL HIGH (ref 0.57–1.00)
Glucose: 157 mg/dL — ABNORMAL HIGH (ref 70–99)
Potassium: 4.8 mmol/L (ref 3.5–5.2)
Sodium: 125 mmol/L — ABNORMAL LOW (ref 134–144)
eGFR: 46 mL/min/{1.73_m2} — ABNORMAL LOW (ref >59)

## 2023-06-27 ENCOUNTER — Encounter (HOSPITAL_BASED_OUTPATIENT_CLINIC_OR_DEPARTMENT_OTHER): Payer: Self-pay | Admitting: Pharmacist Clinician (PhC)/ Clinical Pharmacy Specialist

## 2023-06-27 ENCOUNTER — Ambulatory Visit: Payer: Medicare Other | Attending: Cardiology | Admitting: Pharmacist Clinician (PhC)/ Clinical Pharmacy Specialist

## 2023-06-27 ENCOUNTER — Encounter: Payer: Self-pay | Admitting: Pharmacist Clinician (PhC)/ Clinical Pharmacy Specialist

## 2023-06-27 VITALS — BP 161/50 | HR 40

## 2023-06-27 DIAGNOSIS — I10 Essential (primary) hypertension: Secondary | ICD-10-CM | POA: Diagnosis not present

## 2023-06-27 MED ORDER — DOXAZOSIN MESYLATE 2 MG PO TABS: 2.0000 mg | ORAL_TABLET | Freq: Every day | ORAL | 6 refills | Status: DC

## 2023-06-27 MED ORDER — NEBIVOLOL HCL 2.5 MG PO TABS: 2.5000 mg | ORAL_TABLET | Freq: Every day | ORAL | 6 refills | Status: DC

## 2023-06-27 NOTE — Progress Notes (Signed)
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 female patient of Dr Elease Hashimoto, in the office today for hypertension management.  She was recently seen by Dr. Elease Hashimoto (Oct 4), with a pressure of 156/86.  She was having PVC's, that he believed were contributing to her higher blood pressure, and started her on flecainide 50 mg twice daily.  She was then scheduled to follow up with NP visit in early November.   That appointment still is on the schedule, but patient sent a MyChart message about a week later noting elevated BP readings at home, believing that the carvedilol was raising her pressure.  She also noted dizziness/lightheadedness.  Over the next 10 days there were multiple messages that went back and forth, and in that time she was started on both valsartan 320 and chlorthalidone 25, in addition the the amlodipine and carvedilol.  The rapid changes were causing some confusion, so we worked her into the PharmD schedule at the [place] office.   At that appointment we didn't change medications, although a week or so later had to switch carvedilol to metoprolol 2/2 fatigue.  Unfortunately the metoprolol caused similar symptoms, so it was discontinued as well.  After just 2-3 days without this she re-started, as her systolic BP was going back up towards 180.    Today she returns for follow up.  Home readings have been essentially unchanged and she continues to feel fatigue on metoprolol.   Will need to discontinue chlorthalidone as  well, 2/2 hyponatremia.    Blood Pressure Goal:  130/80  Previously tried:  carvedilol, metoprolol - fatigue, SOB, bradycardia   Hydralazine - diarrhea, stomach cramping   Diuretics - hyponatremia  Current Medications:  amlodipine 10 mg every day (am), metoprolol succ  25 mg every day, chlorthalidone 25 mg every day (am), valsartan 320 mg every day (pm)  Social Hx:      Tobacco: no  Alcohol: no  Caffeine: none in past few weeks  Diet:   drinking aobut 40 oz - premier protein, gatorade 12 oz  for last few days, OJ ; doesn't cook much anymore, not a lot of desire to eat; yougurt once daily w/cereal, brad with peanut butter; does eat salad most days (lettuce, tomatoes, parmesean, [place] breast)  Exercise: yoga 3 days per week (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)  Home BP readings: home Omron cuff read within 10 points of office device 16 home readings average 168/67  (range 152-178/61-78), previous average 183/78   Accessory Clinical Findings    Lab Results  Component Value Date   CREATININE 1.20 (H) 06/23/2023   BUN 30 (H) 06/23/2023   NA 125 (L) 06/23/2023   K 4.8 06/23/2023   CL 90 (L) 06/23/2023   CO2 17 (L) 06/23/2023   Lab Results  Component Value Date   ALT 16 06/13/2022   AST 18 06/13/2022   ALKPHOS 77 06/13/2022   BILITOT 0.4 06/13/2022  Lab Results  Component Value Date   HGBA1C 6.1 (H) 06/13/2022    Home Medications    Current Outpatient Medications  Medication Sig Dispense Refill   allopurinol (ZYLOPRIM) 100 MG tablet Take 100 mg by mouth daily.     amLODipine (NORVASC) 10 MG tablet TAKE 1 TABLET(10 MG) BY MOUTH DAILY 90 tablet 2   doxazosin (CARDURA) 2 MG tablet Take 1 tablet (2 mg total) by mouth daily. 30 tablet 6   flecainide (TAMBOCOR) 50 MG tablet Take 1 tablet (50 mg total) by mouth 2 (two) times daily. 180 tablet 3   nebivolol (BYSTOLIC) 2.5 MG tablet Take 1 tablet (2.5 mg total) by mouth daily. 30 tablet 6   rosuvastatin (CRESTOR) 5 MG  tablet Take 5 mg by mouth daily.     valsartan (DIOVAN) 320 MG tablet Take 1 tablet (320 mg total) by mouth daily. 90 tablet 3   vitamin D3 (CHOLECALCIFEROL) 25 MCG tablet Take 1,000 Units by mouth daily.     No current facility-administered medications for this visit.     Assessment & Plan    HTN (hypertension) Assessment: BP is uncontrolled in office BP 161/50 mmHg;  above the goal (<130/80). HR at 40 in the office today, home readings from 40-60 Most recent labs with sodium low at 125 Encouraged to add a little sodium to her diet, as current diet is rather restrictive Reiterated the importance of regular exercise  Plan:  Stop taking chlorthalidone (hyponatremia) and metoprolol (bradycardia) Start taking nebivolol 2.5 mg once daily  Start taking doxazosin 2 mg once daily at bedtime Continue taking amlodipine 10 mg and valsartan 320 mg, both once daily Will confer with Eligha Bridegroom about doing 2-14 day monitor - patient would like to see if HR increases with discontinuation of metoprolol and addition of low dose nebivolol Patient to keep record of BP readings with heart rate and report to [place] at the next visit Patient to follow up with PharmD in 1 month  Labs ordered today:  none   Phillips Hay PharmD CPP [facility]  [address] [place], [place] [address] [phone]

## 2023-06-27 NOTE — Patient Instructions (Signed)
Follow up appointment: Friday December 20 at 3 pm at the [place] office  Take your BP meds as follows:  Stop chlorthalidone - sodium level low  Stop metoprolol   Start nebivolol 2.5 mg once daily  Start doxazosin 2 mg nightly at bedtime  I will reach out to [place] about ordering a 2-14 day monitor ordered  Check your blood pressure at home daily (if able) and keep record of the readings.  Hypertension "High blood pressure"  Hypertension is often called "The Silent Killer." It rarely causes symptoms until it is extremely  high or has done damage to other organs in the body. For this reason, you should have your  blood pressure checked regularly by your physician. We will check your blood pressure  every time you see a provider at one of our offices.   Your blood pressure reading consists of two numbers. Ideally, blood pressure should be  below 120/80. The first ("top") number is called the systolic pressure. It measures the  pressure in your arteries as your heart beats. The second ("bottom") number is called the diastolic pressure. It measures the pressure in your arteries as the heart relaxes between beats.  The benefits of getting your blood pressure under control are enormous. A 10-point  reduction in systolic blood pressure can reduce your risk of stroke by 27% and heart failure by 28%  Your blood pressure goal is < 130/80  To check your pressure at home you will need to:  1. Sit up in a chair, with feet flat on the floor and back supported. Do not cross your ankles or legs. 2. Rest your left arm so that the cuff is about heart level. If the cuff goes on your upper arm,  then just relax the arm on the table, arm of the chair or your lap. If you have a wrist cuff, we  suggest relaxing your wrist against your chest (think of it as Pledging the Flag with the  wrong arm).  3. Place the cuff snugly around your arm, about 1 inch above the crook of your elbow. The  cords  should be inside the groove of your elbow.  4. Sit quietly, with the cuff in place, for about 5 minutes. After that 5 minutes press the power  button to start a reading. 5. Do not talk or move while the reading is taking place.  6. Record your readings on a sheet of paper. Although most cuffs have a memory, it is often  easier to see a pattern developing when the numbers are all in front of you.  7. You can repeat the reading after 1-3 minutes if it is recommended  Make sure your bladder is empty and you have not had caffeine or tobacco within the last 30 min  Always bring your blood pressure log with you to your appointments. If you have not brought your monitor in to be double checked for accuracy, please bring it to your next appointment.  You can find a list of quality blood pressure cuffs at validatebp.org

## 2023-06-27 NOTE — Assessment & Plan Note (Addendum)
Assessment: BP is uncontrolled in office BP 161/50 mmHg;  above the goal (<130/80). HR at 40 in the office today, home readings from 40-60 Most recent labs with sodium low at 125 Encouraged to add a little sodium to her diet, as current diet is rather restrictive Reiterated the importance of regular exercise  Plan:  Stop taking chlorthalidone (hyponatremia) and metoprolol (bradycardia) Start taking nebivolol 2.5 mg once daily  Start taking doxazosin 2 mg once daily at bedtime Continue taking amlodipine 10 mg and valsartan 320 mg, both once daily Will confer with Eligha Bridegroom about doing 2-14 day monitor - patient would like to see if HR increases with discontinuation of metoprolol and addition of low dose nebivolol Patient to keep record of BP readings with heart rate and report to [place] at the next visit Patient to follow up with PharmD in 1 month  Labs ordered today:  none

## 2023-07-06 ENCOUNTER — Encounter: Payer: Self-pay | Admitting: Pharmacist Clinician (PhC)/ Clinical Pharmacy Specialist

## 2023-07-22 ENCOUNTER — Encounter (HOSPITAL_BASED_OUTPATIENT_CLINIC_OR_DEPARTMENT_OTHER): Payer: Self-pay | Admitting: Pharmacist Clinician (PhC)/ Clinical Pharmacy Specialist

## 2023-07-22 ENCOUNTER — Ambulatory Visit (HOSPITAL_BASED_OUTPATIENT_CLINIC_OR_DEPARTMENT_OTHER): Payer: Medicare Other | Admitting: Pharmacist Clinician (PhC)/ Clinical Pharmacy Specialist

## 2023-07-22 VITALS — BP 141/50 | HR 50

## 2023-07-22 DIAGNOSIS — I10 Essential (primary) hypertension: Secondary | ICD-10-CM | POA: Diagnosis not present

## 2023-07-22 MED ORDER — DOXAZOSIN MESYLATE 2 MG PO TABS: 2.0000 mg | ORAL_TABLET | Freq: Every day | ORAL | 6 refills | Status: DC

## 2023-07-22 MED ORDER — VALSARTAN 160 MG PO TABS: 160.0000 mg | ORAL_TABLET | Freq: Every day | ORAL | 3 refills | Status: DC

## 2023-07-22 NOTE — Progress Notes (Unsigned)
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 female patient of Dr Elease Hashimoto, in the office today for hypertension management.  She was recently seen by Dr. Elease Hashimoto (Oct 4), with a pressure of 156/86.  She was having PVC's, that he believed were contributing to her higher blood pressure, and started her on flecainide 50 mg twice daily.  She was then scheduled to follow up with NP visit in early November.   That appointment still is on the schedule, but patient sent a MyChart message about a week later noting elevated BP readings at home, believing that the carvedilol was raising her pressure.  She also noted dizziness/lightheadedness.  Over the next 10 days there were multiple messages that went back and forth, and in that time she was started on both valsartan 320 and chlorthalidone 25, in addition the the amlodipine and carvedilol.  The rapid changes were causing some confusion, so we worked her into the PharmD schedule at the [place] office.   At that appointment we didn't change medications, although a week or so later had to switch carvedilol to metoprolol 2/2 fatigue.  Unfortunately the metoprolol caused similar symptoms, so it was discontinued as well.  After just 2-3 days without this she re-started, as her systolic BP was going back up towards 180.  At her last visit we stopped the chlorthalidone (hyponatremia) and metoprolol (bradycardia) and started nebivolol 2.5 mg daily and doxazosin 2 mg at bedtime.    Today she returns  for follow up.  Her daughter is visiting from [place] and here with her today.  She continues to complain about shortness of breath and fatigue.  She believes these symptoms are medication caused and we had a long discussion today that it could also be caused by the irregular beats.  She is hopeful that if the heart beat can be fixed, she can come off of some of her medications, so I explained that she shouldn't see that as her goal, but rather relief of symptoms, and to get that she may need to increase or adjust medications, rather than just stop them.  Today she also says that she feels better and less fatigued since cutting back the valsartan to 160 mg.    Blood Pressure Goal:  130/80  Previously tried:  carvedilol, metoprolol - fatigue, SOB, bradycardia   Hydralazine - diarrhea, stomach cramping   Diuretics - hyponatremia  Current Medications:  amlodipine 10 mg every day (am), valsartan 160 mg every day (pm), doxazosin 2 mg at bedtime   Social Hx:      Tobacco: no  Alcohol: no  Caffeine: none in past few weeks  Diet:   drinking aobut 40 oz - premier protein, gatorade 12 oz  for last few days, OJ ; doesn't cook much anymore, not a lot of desire to eat; yougurt once daily w/cereal, brad with peanut butter; does eat salad most days (lettuce, tomatoes, parmesean, [place] breast)  Exercise: yoga  3 days per week (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)  Home BP readings: home Omron cuff read within 10 points of office device 16 home readings average 168/67  (range 152-178/61-78), previous average 183/78   Accessory Clinical Findings    Lab Results  Component Value Date   CREATININE 1.20 (H) 06/23/2023   BUN 30 (H) 06/23/2023   NA 125 (L) 06/23/2023   K 4.8 06/23/2023   CL 90 (L) 06/23/2023   CO2 17 (L) 06/23/2023   Lab Results  Component Value Date   ALT 16 06/13/2022   AST 18 06/13/2022   ALKPHOS 77 06/13/2022   BILITOT 0.4 06/13/2022   Lab Results  Component Value Date    HGBA1C 6.1 (H) 06/13/2022    Home Medications    Current Outpatient Medications  Medication Sig Dispense Refill   allopurinol (ZYLOPRIM) 100 MG tablet Take 100 mg by mouth daily.     amLODipine (NORVASC) 10 MG tablet TAKE 1 TABLET(10 MG) BY MOUTH DAILY 90 tablet 2   flecainide (TAMBOCOR) 50 MG tablet Take 1 tablet (50 mg total) by mouth 2 (two) times daily. 180 tablet 3   rosuvastatin (CRESTOR) 5 MG tablet Take 5 mg by mouth daily.     valsartan (DIOVAN) 160 MG tablet Take 1 tablet (160 mg total) by mouth daily. 90 tablet 3   vitamin D3 (CHOLECALCIFEROL) 25 MCG tablet Take 1,000 Units by mouth daily.     doxazosin (CARDURA) 2 MG tablet Take 1 tablet (2 mg total) by mouth daily. 30 tablet 6   No current facility-administered medications for this visit.     Assessment & Plan    HTN (hypertension) Assessment: BP is un/controlled in office BP 141/50 mmHg;  above the goal (<130/80). Tolerates amlodipine 10 mg, valsartan 160 mg and doxazosin 2 mg well without any side effects Denies chest pain, headaches, SOB unchanged from past visit, feels fatigue is slightly improved Reiterated the importance of regular exercise and low salt diet   Plan:  Continue taking amlodipine 10 mg every day (am), valsartan 160 mg every day (pm), doxazosin 2 mg at bedtime  On January 1, please increase doxazosin to 4 mg nightly. Patient to keep record of BP readings with heart rate and report to [place] at the next visit Patient to follow up with Eligha Bridegroom in February  Labs ordered today:  none   Phillips Hay PharmD CPP [facility]  [address] [place], [place] [address] [phone]

## 2023-07-22 NOTE — Patient Instructions (Signed)
Follow up appointment: with Eligha Bridegroom in January/February and Dr. Melburn Popper in late spring  Take your BP meds as follows:  Continue with amlodipine 10 mg daily, valsartan 160 mg daily, doxazosin 2 mg daily  On January 1 increase the doxazosin to 4 mg daily (nightly at bedtime)  Check your blood pressure at home daily (if able) and keep record of the readings.  Your blood pressure goal is < 130/80  To check your pressure at home you will need to:  1. Sit up in a chair, with feet flat on the floor and back supported. Do not cross your ankles or legs. 2. Rest your left arm so that the cuff is about heart level. If the cuff goes on your upper arm,  then just relax the arm on the table, arm of the chair or your lap. If you have a wrist cuff, we  suggest relaxing your wrist against your chest (think of it as Pledging the Flag with the  wrong arm).  3. Place the cuff snugly around your arm, about 1 inch above the crook of your elbow. The  cords should be inside the groove of your elbow.  4. Sit quietly, with the cuff in place, for about 5 minutes. After that 5 minutes press the power  button to start a reading. 5. Do not talk or move while the reading is taking place.  6. Record your readings on a sheet of paper. Although most cuffs have a memory, it is often  easier to see a pattern developing when the numbers are all in front of you.  7. You can repeat the reading after 1-3 minutes if it is recommended  Make sure your bladder is empty and you have not had caffeine or tobacco within the last 30 min  Always bring your blood pressure log with you to your appointments. If you have not brought your monitor in to be double checked for accuracy, please bring it to your next appointment.  You can find a list of quality blood pressure cuffs at [email or web address]  Important lifestyle changes to control high blood pressure  Intervention  Effect on the BP  Lose extra pounds and watch your  waistline Weight loss is one of the most effective lifestyle changes for controlling blood pressure. If you're overweight or obese, losing even a small amount of weight can help reduce blood pressure. Blood pressure might go down by about 1 millimeter of mercury (mm Hg) with each kilogram (about 2.2 pounds) of weight lost.  Exercise regularly As a general goal, aim for at least 30 minutes of moderate physical activity every day. Regular physical activity can lower high blood pressure by about 5 to 8 mm Hg.  Eat a healthy diet Eating a diet rich in whole grains, fruits, vegetables, and low-fat dairy products and low in saturated fat and cholesterol. A healthy diet can lower high blood pressure by up to 11 mm Hg.  Reduce salt (sodium) in your diet Even a small reduction of sodium in the diet can improve heart health and reduce high blood pressure by about 5 to 6 mm Hg.  Limit alcohol One drink equals 12 ounces of beer, 5 ounces of wine, or 1.5 ounces of 80-proof liquor.  Limiting alcohol to less than one drink a day for women or two drinks a day for men can help lower blood pressure by about 4 mm Hg.   If you have any questions or concerns please use My Chart to  send questions or call the office at [phone]

## 2023-07-22 NOTE — Assessment & Plan Note (Signed)
Assessment: BP is un/controlled in office BP 141/50 mmHg;  above the goal (<130/80). Tolerates amlodipine 10 mg, valsartan 160 mg and doxazosin 2 mg well without any side effects Denies chest pain, headaches, SOB unchanged from past visit, feels fatigue is slightly improved Reiterated the importance of regular exercise and low salt diet   Plan:  Continue taking amlodipine 10 mg every day (am), valsartan 160 mg every day (pm), doxazosin 2 mg at bedtime  On January 1, please increase doxazosin to 4 mg nightly. Patient to keep record of BP readings with heart rate and report to [place] at the next visit Patient to follow up with Eligha Bridegroom in February  Labs ordered today:  none

## 2023-07-31 ENCOUNTER — Encounter (HOSPITAL_BASED_OUTPATIENT_CLINIC_OR_DEPARTMENT_OTHER): Payer: Self-pay | Admitting: Pharmacist Clinician (PhC)/ Clinical Pharmacy Specialist

## 2023-08-14 ENCOUNTER — Other Ambulatory Visit: Payer: Self-pay | Admitting: Nurse Practitioner

## 2023-08-14 ENCOUNTER — Encounter (HOSPITAL_BASED_OUTPATIENT_CLINIC_OR_DEPARTMENT_OTHER): Payer: Self-pay | Admitting: Pharmacist Clinician (PhC)/ Clinical Pharmacy Specialist

## 2023-09-19 ENCOUNTER — Other Ambulatory Visit: Payer: Self-pay

## 2023-09-19 ENCOUNTER — Telehealth: Payer: Self-pay | Admitting: Cardiovascular Disease

## 2023-09-19 MED ORDER — DOXAZOSIN MESYLATE 2 MG PO TABS: 2.0000 mg | ORAL_TABLET | Freq: Every day | ORAL | 3 refills | Status: DC

## 2023-09-19 NOTE — Telephone Encounter (Signed)
Pt c/o medication issue:  1. Name of Medication: doxazosin (CARDURA) 2 MG tablet   2. How are you currently taking this medication (dosage and times per day)? 2x daily for a total of 4mg   3. Are you having a reaction (difficulty breathing--STAT)? no  4. What is your medication issue? [employer] is needing prior auth on this medications.

## 2023-09-22 MED ORDER — DOXAZOSIN MESYLATE 4 MG PO TABS: 4.0000 mg | ORAL_TABLET | Freq: Every day | ORAL | 1 refills | Status: DC

## 2023-09-22 NOTE — Telephone Encounter (Signed)
Pt seen by PharmD [facility] Teresita Madura on 07/22/23:  Plan:  Continue taking amlodipine 10 mg every day (am), valsartan 160 mg every day (pm), doxazosin 2 mg at bedtime  On January 1, please increase doxazosin to 4 mg nightly. Patient to keep record of BP readings with heart rate and report to [place] at the next visit Patient to follow up with Eligha Bridegroom in February  Labs ordered today:  none  We received a prior authorization request for Doxazosin 2mg  because patient has ran out of medication and it's too soon to be filled. A new prescription for 4mg  tablets was never sent to pharmacy reflecting increased dose. New prescription sent at this time.

## 2023-09-25 NOTE — Progress Notes (Unsigned)
 Cardiology Office Note:  .   Date:  09/26/2023  ID:  Deborah Logan, DOB [birth year], MRN [id number] PCP: Irven Coe, MD  [facility] Providers Cardiologist:  Kristeen Miss, MD    Patient Profile: .      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  She reported missing her home medications. EKG showed no acute changes but did note first-degree AV block with ventricular bigeminy, mild D-dimer elevation.  Coronary CT was negative for acute PE.  CXR was negative for cardiopulmonary abnormality.  BNP elevated at 928.8 and troponins trended 18 >> 21.  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  Her atenolol was changed to carvedilol and she was advised to take amlodipine 10 mg daily.  At follow-up visit BP remained elevated and carvedilol was increased to 37.5 mg twice daily.  She reported having dizziness with this dose.  At follow-up visit with Dr. Elease Hashimoto on 02/01/2023 she was advised to reduce carvedilol to 25 mg twice daily and increase valsartan to 160 mg daily.    At office visit with Dr. Elease Hashimoto 05/06/2023 she was having frequent PVCs.  BP was elevated, felt to be a result of frequent PVCs.  She was started on flecainide 50 mg twice daily.  There were multiple messages regarding medication changes and she was referred to hypertension clinic.  Seen by Phylis Bougie, RPH on 05/24/2023 for management of hypertension.  At that time she was taking amlodipine 10 mg daily, carvedilol 6.25 mg twice daily, chlorthalidone 25 mg daily, and valsartan 320 mg daily. BP in the office was 161/61 mmHg. No medication changes were made. BMP ordered for 1 week later. She was encouraged to  discontinue Gatorade and drink plain water.  Low-sodium diet and regular exercise were emphasized and follow-up visit was scheduled.  She contacted our office on 06/01/23 to report fatigue. She was advised to stop carvedilol and start metoprolol succinate 50 mg daily to see if this helps improve fatigue.    At clinic visit 06/27/23 with Phillips Hay, [facility],  current medication regimen was: amlodipine 10 mg daily in the AM, chlorthalidone 25 mg daily in the AM, metop succinate 25 mg daily, and valsartan 320 mg daily in the PM. She did not tolerate hydralazine due to diarrhea and stomach cramping or diuretics 2/2 hyponatremia. No significant caffeine consumption and no Etoh or tobacco use. Home BP cuff within 10 points of office device with average 152-178/61-78 (previous average was 183/78). Sodium remained low at 125 on labs 06/23/23. Clinic BP was 161/50 mmHg, HR 40 bpm (home readings 40-60 bpm). She was advised to stop chlorthalidone 2/2 hyponatremia and stop metoprolol 2/2 bradycardia.  She was advised to start nebivolol 2.5 mg once daily and doxazosin 2 mg once daily at bedtime and to continue amlodipine 10 mg and valsartan 320 mg both once daily.  Last cardiology clinic visit was 07/22/23  with Phillips Hay, [facility]. She was accompanied by her daughter visiting from [place].  She continued to complain of shortness of breath and fatigue but reported fatigued improved on lower dose of valsartan. On 07/20/2023, valsartan was reduced to 160 mg daily secondary to fatigue. Clinic BP was 141/50 mmHg.  She was advised to increase doxazosin to 4 mg nightly on 08/03/2023 and keep a record of BP and pulse readings for follow-up appointment.      History of Present Illness: .   Deborah Logan is a very pleasant 78 y.o. female who is here today for follow-up of hypertension.  She reports feeling better and having more energy since discontinuing beta blockers. She was fatigued to the point of exhaustion and did not feel  motivated to exercise. She also unfortunately had COVID infection 08/02/23. She has not monitored BP as regularly since her illness.  She increase doxazosin to 4 mg 08/03/2023 as instructed.  She reports infrequent waves of dizziness since increasing the dosage of doxazosin but no presyncope or syncope. She admits to not eating a healthy diet. She does not have a hearty appetite and often skips meals. She has lost 25 pounds in the past year but struggles with trying to lose more weight and exercise consistently.  She has a stationary bike at home that she has not been using recently.  She was previously consistently attending yoga but has missed for the past couple of months.  She plans to resume in 2 days. Home BP typically 140s-150s over 70s.  Says she is feeling the best today that she has felt in a long time.  She denies chest pain, shortness of breath, palpitations, edema, orthopnea, PND.  Current anti-hypertensive regimen:  Amlodipine 10 mg daily - AM Valsartan 160 mg daily - PM  Doxazosin 4 mg at bedtime  Discussed the use of AI scribe software for clinical note transcription with the patient, who gave verbal consent to proceed.   ROS: See HPI       Studies Reviewed: Marland Kitchen   EKG Interpretation Date/Time:  Monday September 26 2023 11:08:01 EST Ventricular Rate:  81 PR Interval:  246 QRS Duration:  102 QT Interval:  406 QTC Calculation: 471 R Axis:   -52  Text Interpretation: Sinus rhythm with marked sinus arrhythmia with 1st degree A-V block Left axis deviation When compared with ECG of 06-Jun-2023 08:50, No significant change was found Confirmed by Eligha Bridegroom [phone]) on 09/26/2023 11:27:41 AM     Risk Assessment/Calculations:     HYPERTENSION CONTROL Vitals:   09/26/23 1103 09/26/23 1326  BP: (!) 148/46 (!) 140/50    The patient's blood pressure is elevated above target today.  In order to address the patient's elevated BP:           Physical Exam:   VS:  BP (!) 140/50    Pulse 83   Ht 5' 4.5" (1.638 m)   Wt 177 lb 12.8 oz (80.6 kg)   SpO2 99%   BMI 30.05 kg/m    Wt Readings from Last 3 Encounters:  09/26/23 177 lb 12.8 oz (80.6 kg)  06/06/23 181 lb (82.1 kg)  05/06/23 186 lb 9.6 oz (84.6 kg)    GEN: Well nourished, well developed in no acute distress NECK: No JVD; No carotid bruits CARDIAC: RRR, no murmurs, rubs, gallops RESPIRATORY:  Clear to auscultation without rales, wheezing or rhonchi  ABDOMEN: Soft, non-tender, non-distended EXTREMITIES:  No edema; No deformity     ASSESSMENT AND PLAN: .    Resistant hypertension: BP is elevated initially and remains elevated on my recheck. Home BP is elevated as well. Home monitor has been calibrated with manual cuff in the office. She is feeling better since discontinuing beta blocker. She has not been monitoring BP as consistently recently.  Lengthy discussion about lifestyle modification as she does not want to take additional medication.  I have asked her to monitor BP consistently and report in 1 month. We will update BMET to ensure stable renal function. Discussed consideration of testing of secondary causes of resistant HTN, however she would like to hold off for now. We will continue current anti-hypertensive regimen with amlodipine 10 mg every morning, valsartan 160 mg every evening, and doxazosin 4 mg at bedtime.    Frequent PVCs: Palpitations have improved since starting flecainide. She has tried multiple beta-blockers and did not tolerate them due to fatigue and bradycardia. She is feeling improvement in energy level. EKG reveals stable QTc. HR has increased slightly but no significant tachycardia to her awareness. We will continue flecainide at current dose of 50 mg twice daily.   High risk medication use: On flecainide for frequent PVCs. She denies palpitations, chest discomfort, presyncope or syncope. EKG completed today reveals stable QTc. Continue flecainide.  CKD: History of acute nephritis with  subsequent chronic kidney disease. Scr 1.2 on 06/23/23 on valsartan 160 mg daily. We will recheck BMET today. Continue adequate hydration.   Hyponatremia: Sodium level 125 on labs completed 06/23/23. She is no longer on diuretic therapy. We will recheck today.        Disposition: 3 months with me or Phillips Hay, RPH  Signed, Eligha Bridegroom, NP-C

## 2023-09-26 ENCOUNTER — Other Ambulatory Visit: Payer: Self-pay | Admitting: *Deleted

## 2023-09-26 ENCOUNTER — Telehealth: Payer: Self-pay | Admitting: *Deleted

## 2023-09-26 ENCOUNTER — Ambulatory Visit: Payer: Medicare Other | Attending: Nurse Practitioner | Admitting: Nurse Practitioner

## 2023-09-26 ENCOUNTER — Encounter: Payer: Self-pay | Admitting: Nurse Practitioner

## 2023-09-26 ENCOUNTER — Encounter (HOSPITAL_BASED_OUTPATIENT_CLINIC_OR_DEPARTMENT_OTHER): Payer: Self-pay

## 2023-09-26 VITALS — BP 140/50 | HR 83 | Ht 64.5 in | Wt 177.8 lb

## 2023-09-26 DIAGNOSIS — E871 Hypo-osmolality and hyponatremia: Secondary | ICD-10-CM | POA: Diagnosis present

## 2023-09-26 DIAGNOSIS — N1832 Chronic kidney disease, stage 3b: Secondary | ICD-10-CM | POA: Diagnosis present

## 2023-09-26 DIAGNOSIS — Z79899 Other long term (current) drug therapy: Secondary | ICD-10-CM

## 2023-09-26 DIAGNOSIS — I1A0 Resistant hypertension: Secondary | ICD-10-CM

## 2023-09-26 DIAGNOSIS — I493 Ventricular premature depolarization: Secondary | ICD-10-CM

## 2023-09-26 NOTE — Telephone Encounter (Signed)
Patient following up.

## 2023-09-26 NOTE — Telephone Encounter (Signed)
 Lvm for pt as I stated on prior phone call.  Deborah Logan needs pt to come back at pt's earliest convenience some time this week and get labs drawn. Pt is to report bp readings in a month through mychart.

## 2023-09-26 NOTE — Telephone Encounter (Signed)
 S/w pt is aware to go to [facility] for a BMET.  Orders placed in system and released. Pt is aware at pt's convenience.

## 2023-09-26 NOTE — Patient Instructions (Signed)
 Medication Instructions:   Your physician recommends that you continue on your current medications as directed. Please refer to the Current Medication list given to you today.   *If you need a refill on your cardiac medications before your next appointment, please call your pharmacy*   Lab Work:  None ordered.  If you have labs (blood work) drawn today and your tests are completely normal, you will receive your results only by: MyChart Message (if you have MyChart) OR A paper copy in the mail If you have any lab test that is abnormal or we need to change your treatment, we will call you to review the results.   Testing/Procedures:  None ordered.   Follow-Up: At [facility], you and your health needs are our priority.  As part of our continuing mission to provide you with exceptional heart care, we have created designated Provider Care Teams.  These Care Teams include your primary Cardiologist (physician) and Advanced Practice Providers (APPs -  Physician Assistants and Nurse Practitioners) who all work together to provide you with the care you need, when you need it.  We recommend signing up for the patient portal called "MyChart".  Sign up information is provided on this After Visit Summary.  MyChart is used to connect with patients for Virtual Visits (Telemedicine).  Patients are able to view lab/test results, encounter notes, upcoming appointments, etc.  Non-urgent messages can be sent to your provider as well.   To learn more about what you can do with MyChart, go to [email or web address].    Your next appointment:   3 month(s)  Provider:   Eligha Bridegroom, NP    Other Instructions  HOW TO TAKE YOUR BLOOD PRESSURE  Rest 5 minutes before taking your blood pressure. Don't  smoke or drink caffeinated beverages for at least 30 minutes before. Take your blood pressure before (not after) you eat. Sit comfortably with your back supported and both feet on the floor  ( don't cross your legs). Elevate your arm to heart level on a table or a desk. Use the proper sized cuff.  It should fit smoothly and snugly around your bare upper arm.  There should be  Enough room to slip a fingertip under the cuff.  The bottom edge of the cuff should be 1 inch above the crease Of the elbow. Please monitor your blood pressure once daily 2 hours after your am medication. Your goal is 140/80 or below.  Please send mychart message in 1 month with All your BP readings.  ----Avoid cold medicines with D or DM at the end of them----

## 2023-09-28 LAB — BASIC METABOLIC PANEL
BUN/Creatinine Ratio: 28 (ref 12–28)
BUN: 43 mg/dL — ABNORMAL HIGH (ref 8–27)
CO2: 18 mmol/L — ABNORMAL LOW (ref 20–29)
Calcium: 9.2 mg/dL (ref 8.7–10.3)
Chloride: 103 mmol/L (ref 96–106)
Creatinine, Ser: 1.53 mg/dL — ABNORMAL HIGH (ref 0.57–1.00)
Glucose: 114 mg/dL — ABNORMAL HIGH (ref 70–99)
Potassium: 5.3 mmol/L — ABNORMAL HIGH (ref 3.5–5.2)
Sodium: 138 mmol/L (ref 134–144)
eGFR: 35 mL/min/{1.73_m2} — ABNORMAL LOW (ref >59)

## 2023-09-29 ENCOUNTER — Encounter (HOSPITAL_BASED_OUTPATIENT_CLINIC_OR_DEPARTMENT_OTHER): Payer: Self-pay

## 2023-09-29 ENCOUNTER — Other Ambulatory Visit: Payer: Self-pay | Admitting: *Deleted

## 2023-09-29 DIAGNOSIS — Z79899 Other long term (current) drug therapy: Secondary | ICD-10-CM

## 2023-09-29 DIAGNOSIS — N1832 Chronic kidney disease, stage 3b: Secondary | ICD-10-CM

## 2023-09-30 MED ORDER — VALSARTAN 80 MG PO TABS: 80.0000 mg | ORAL_TABLET | Freq: Every day | ORAL | Status: DC

## 2023-10-05 ENCOUNTER — Other Ambulatory Visit: Payer: Self-pay

## 2023-10-05 MED ORDER — FLECAINIDE ACETATE 50 MG PO TABS: 50.0000 mg | ORAL_TABLET | Freq: Two times a day (BID) | ORAL | 3 refills | Status: DC

## 2023-10-06 ENCOUNTER — Other Ambulatory Visit: Payer: Self-pay | Admitting: *Deleted

## 2023-10-06 ENCOUNTER — Encounter (HOSPITAL_BASED_OUTPATIENT_CLINIC_OR_DEPARTMENT_OTHER): Payer: Self-pay

## 2023-10-06 DIAGNOSIS — Z79899 Other long term (current) drug therapy: Secondary | ICD-10-CM

## 2023-10-06 DIAGNOSIS — N1832 Chronic kidney disease, stage 3b: Secondary | ICD-10-CM

## 2023-10-06 LAB — BASIC METABOLIC PANEL
BUN/Creatinine Ratio: 30 — ABNORMAL HIGH (ref 12–28)
BUN: 53 mg/dL — ABNORMAL HIGH (ref 8–27)
CO2: 18 mmol/L — ABNORMAL LOW (ref 20–29)
Calcium: 9.1 mg/dL (ref 8.7–10.3)
Chloride: 104 mmol/L (ref 96–106)
Creatinine, Ser: 1.78 mg/dL — ABNORMAL HIGH (ref 0.57–1.00)
Glucose: 98 mg/dL (ref 70–99)
Potassium: 5.6 mmol/L — ABNORMAL HIGH (ref 3.5–5.2)
Sodium: 137 mmol/L (ref 134–144)
eGFR: 29 mL/min/{1.73_m2} — ABNORMAL LOW (ref >59)

## 2023-10-14 ENCOUNTER — Encounter (HOSPITAL_BASED_OUTPATIENT_CLINIC_OR_DEPARTMENT_OTHER): Payer: Self-pay

## 2023-10-14 LAB — BASIC METABOLIC PANEL
BUN/Creatinine Ratio: 26 (ref 12–28)
BUN: 35 mg/dL — ABNORMAL HIGH (ref 8–27)
CO2: 18 mmol/L — ABNORMAL LOW (ref 20–29)
Calcium: 9.5 mg/dL (ref 8.7–10.3)
Chloride: 103 mmol/L (ref 96–106)
Creatinine, Ser: 1.34 mg/dL — ABNORMAL HIGH (ref 0.57–1.00)
Glucose: 112 mg/dL — ABNORMAL HIGH (ref 70–99)
Potassium: 4.7 mmol/L (ref 3.5–5.2)
Sodium: 138 mmol/L (ref 134–144)
eGFR: 41 mL/min/{1.73_m2} — ABNORMAL LOW (ref >59)

## 2023-10-14 NOTE — Telephone Encounter (Signed)
 Marland Kitchen

## 2023-10-18 MED ORDER — HYDRALAZINE HCL 25 MG PO TABS: 25.0000 mg | ORAL_TABLET | Freq: Two times a day (BID) | ORAL | 11 refills | Status: DC

## 2023-10-24 ENCOUNTER — Other Ambulatory Visit: Payer: Self-pay | Admitting: Nurse Practitioner

## 2023-10-24 ENCOUNTER — Encounter: Payer: Self-pay | Admitting: Cardiovascular Disease

## 2023-11-07 ENCOUNTER — Ambulatory Visit: Payer: Medicare Other | Admitting: Cardiovascular Disease

## 2023-11-30 NOTE — Progress Notes (Signed)
 Cardiology Office Note:  .   Date:  12/01/2023  ID:  Deborah Logan, DOB 1946/05/21, MRN [id number] PCP: Benedetto Brady, MD  [facility] Providers Cardiologist:  Ahmad Alert, MD    Patient Profile: .      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  She reported missing her home medications. EKG showed no acute changes but did note first-degree AV block with ventricular bigeminy, mild D-dimer elevation.  Coronary CT was negative for acute PE.  CXR was negative for cardiopulmonary abnormality.  BNP elevated at 928.8 and troponins trended 18 >> 21.  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  Her atenolol  was changed to carvedilol  and she was advised to take amlodipine  10 mg daily.  At follow-up visit BP remained elevated and carvedilol  was increased to 37.5 mg twice daily.  She reported having dizziness with this dose.  At follow-up visit with Dr. Alroy Aspen on 02/01/2023 she was advised to reduce carvedilol  to 25 mg twice daily and increase valsartan  to 160 mg daily.    At office visit with Dr. Alroy Aspen 05/06/2023 she was having frequent PVCs.  BP was elevated, felt to be a result of frequent PVCs.  She was started on flecainide  50 mg twice daily.  There were multiple messages regarding medication changes and she was referred to hypertension clinic.  Seen by Patric Booker, RPH on 05/24/2023 for management of hypertension.  At that time she was taking amlodipine  10 mg daily, carvedilol  6.25 mg twice daily, chlorthalidone  25 mg daily, and valsartan  320 mg daily. BP in the office was 161/61 mmHg. No medication changes were made. BMP ordered  for 1 week later. She was encouraged to discontinue Gatorade and drink plain water.  Low-sodium diet and regular exercise were emphasized and follow-up visit was scheduled.  She contacted our office on 06/01/23 to report fatigue. She was advised to stop carvedilol  and start metoprolol  succinate 50 mg daily to see if this helps improve fatigue.    At clinic visit 06/27/23 with Kristin Alvstad, [employer],  current medication regimen was: amlodipine  10 mg daily in the AM, chlorthalidone  25 mg daily in the AM, metop succinate 25 mg daily, and valsartan  320 mg daily in the PM. She did not tolerate hydralazine  due to diarrhea and stomach cramping or diuretics 2/2 hyponatremia. No significant caffeine consumption and no Etoh or tobacco use. Home BP cuff within 10 points of office device with average 152-178/61-78 (previous average was 183/78). Sodium remained low at 125 on labs 06/23/23. Clinic BP was 161/50 mmHg, HR 40 bpm (home readings 40-60 bpm). She was advised to stop chlorthalidone  2/2 hyponatremia and stop metoprolol  2/2 bradycardia.  She was advised to start nebivolol  2.5 mg once daily and doxazosin  2 mg once daily at bedtime and to continue amlodipine  10 mg and valsartan  320 mg both once daily.  Seen 07/22/23 by Donivan Furry, RPH. She was accompanied by her daughter visiting from [place] . She continued to complain of shortness of breath and fatigue but reported fatigued improved on lower dose of valsartan . On 07/20/2023, valsartan  was reduced to 160 mg daily secondary to fatigue. Clinic BP was 141/50 mmHg.  She was advised to increase doxazosin  to 4 mg nightly on 08/03/2023 and keep a record of BP and pulse readings for follow-up appointment.  Seen in follow-up by me on 09/26/23 and reported feeling better with more energy since discontinuing beta blockers. She was fatigued to the point of exhaustion and did not feel motivated to exercise while on BB. She also unfortunately had COVID infection 08/02/23. She has  not monitored BP as regularly since her illness.  She increased doxazosin  to 4 mg 08/03/2023 as instructed. Reported infrequent waves of dizziness since increasing the dosage of doxazosin  but no presyncope or syncope. Admitted not eating a healthy diet. She does not have a hearty appetite and often skips meals. She has lost 25 pounds in the past year but struggles with trying to lose more weight and exercise consistently.  Has a stationary bike at home that she has not been using recently.  Previously consistently attended yoga but has missed for the past couple of months, plans to resume exercise in 2 days. Home BP typically 140s-150s over 70s.  Says she is feeling the best today that she has felt in a long time. No  chest pain, shortness of breath, palpitations, edema, orthopnea, PND. We discussed consideration of testing of secondary causes of resistant HTN, however she would like to hold off for now. Plan to continue current anti-hypertensive regimen with amlodipine  10 mg every morning, valsartan  160 mg every evening, and doxazosin  4 mg at bedtime.    Lab results on 09/28/23 revealed Scr 1.53, potassium 5.3 and she was advised to discontinue valsartan  and ensure good hydration with at least 64 ounces of water daily.  Once off valsartan , BMP revealed SCR 1.34 and K4.7 on 10/13/2023.  Due to continued BP elevation, she was advised to start hydralazine  25 mg twice daily.  She previously had GI upset on hydralazine  but was asked to retry due to recent increase in creatinine.      History of Present Illness: .    History of Present Illness KYRIAKI Logan is a very pleasant 78 year old female who presents today for follow-up of hypertension. She experiences anxiety related to her blood pressure, which fluctuates and is difficult to manage, especially on stressful days. Home BP readings have improved since she discontinued valsartan  and started hydralazine  twice daily.  Majority of SBP in 140s, but she also has  readings in 120s-130s. She received distressing news yesterday and had higher than average BP. She takes hydralazine  twice daily, although previously advised to take it three times a day. Feeling well on current therapy. She reports occasional dizziness but no presyncope or syncope. She is exercising regularly with yoga three times a week and riding a stationary bike, which she finds beneficial for her mental health. No noted correlation between exercise and her BP readings.  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  She no longer sees a nephrologist. Newly detected heart murmur is present, which was not noted in previous evaluations. She experiences a hard heartbeat and occasional palpitations, which she associates with her medication. She denies chest pain, shortness of breath, edema, orthopnea, and PND.   Current anti-hypertensive regimen:  Amlodipine  10 mg daily - AM Doxazosin  4 mg at bedtime Hydralazine  25 mg BID - Changing to hydralazine  25 mg in the AM, 10 mg midday and evening  Intolerances: Valsartan  - elevated creatinine, hyperkalemia Beta blockers  Discussed the use of AI scribe software for clinical note transcription  with the patient, who gave verbal consent to proceed.   ROS: See HPI       Studies Reviewed: .         Risk Assessment/Calculations:             Physical Exam:   VS:  BP 138/72   Pulse 96   Ht 5\' 4"  (1.626 m)   Wt 179 lb 4.8 oz (81.3 kg)   SpO2 98%   BMI 30.78 kg/m    Wt Readings from Last 3 Encounters:  12/01/23 179 lb 4.8 oz (81.3 kg)  09/26/23 177 lb 12.8 oz (80.6 kg)  06/06/23 181 lb (82.1 kg)    GEN: Well nourished, well developed in no acute distress NECK: No JVD; No carotid bruits CARDIAC: RRR, no murmurs, rubs, gallops RESPIRATORY:  Clear to auscultation without rales, wheezing or rhonchi  ABDOMEN: Soft, non-tender, non-distended EXTREMITIES:  No edema; No deformity     ASSESSMENT AND PLAN: .     Resistant hypertension: BP initially elevated with improvement on my recheck. Home BP has improved, but she continues to have consistent SBP in the 140s. Home monitor has been calibrated with manual cuff in the office.  She is feeling well on her current regimen.  She is exercising regularly and eating healthy, limiting sodium intake.  Advised goal BP < 140/88. Encouraged her not to check BP when she is stressed or anxious. Through shared decision making, we decided that she will take hydralazine  25 mg in the morning and 10 mg midday and in the evening.  She will consider increasing hydralazine  dose if BP is not at goal 2 to 3 weeks after starting therapy. Continue hydralazine , doxazosin  and amlodipine .  Frequent PVCs: HR initially elevated due to anxiety. HR improved to high 80s with rest. Palpitations have improved since starting flecainide . Intolerant to multiple beta-blockers due to fatigue and bradycardia. HR is well controlled. We will continue flecainide  at current dose of 50 mg twice daily.   High risk medication use: On flecainide  for frequent PVCs. She denies palpitations, chest discomfort, presyncope or syncope. QTc stable on EKG 09/26/23. Continue flecainide .  Coronary calcification: Mild coronary artery calcification noted on CT 06/2022. She denies chest pain, dyspnea, or other symptoms concerning for angina.  No indication for further ischemic evaluation at this time. Focus on secondary prevention including heart healthy mostly plant based diet avoiding saturated fat, processed foods, simple carbohydrates, and sugar along with aiming for at least 150 minutes of moderate intensity exercise each week.   Hyperlipidemia LDL goal < 70: Lipid panel completed 02/08/2023 with total cholesterol 199, HDL 81, LDL-C 104, and triglycerides 75.  We will get updated lipid panel when she can return fasting.  Continue rosuvastatin .  CKD: History of acute nephritis with subsequent chronic kidney disease. Scr  1.34 on 10/13/2023. We discontinued valsartan  due to worsening kidney function and hyperkalemia. Potassium level has now normalized. Continue adequate hydration.  We will get repeat BMET in a few days when she can return fasting.        Disposition: 6 months with me  Signed, Slater Duncan, NP-C

## 2023-12-01 ENCOUNTER — Ambulatory Visit (HOSPITAL_BASED_OUTPATIENT_CLINIC_OR_DEPARTMENT_OTHER): Payer: Medicare Other | Admitting: Nurse Practitioner

## 2023-12-01 ENCOUNTER — Encounter (HOSPITAL_BASED_OUTPATIENT_CLINIC_OR_DEPARTMENT_OTHER): Payer: Self-pay | Admitting: Nurse Practitioner

## 2023-12-01 ENCOUNTER — Other Ambulatory Visit (HOSPITAL_BASED_OUTPATIENT_CLINIC_OR_DEPARTMENT_OTHER): Payer: Self-pay | Admitting: Nurse Practitioner

## 2023-12-01 ENCOUNTER — Encounter (HOSPITAL_BASED_OUTPATIENT_CLINIC_OR_DEPARTMENT_OTHER): Payer: Self-pay

## 2023-12-01 VITALS — BP 138/72 | HR 96 | Ht 64.0 in | Wt 179.3 lb

## 2023-12-01 DIAGNOSIS — N1832 Chronic kidney disease, stage 3b: Secondary | ICD-10-CM | POA: Diagnosis not present

## 2023-12-01 DIAGNOSIS — I1A0 Resistant hypertension: Secondary | ICD-10-CM | POA: Diagnosis not present

## 2023-12-01 DIAGNOSIS — E785 Hyperlipidemia, unspecified: Secondary | ICD-10-CM

## 2023-12-01 DIAGNOSIS — I493 Ventricular premature depolarization: Secondary | ICD-10-CM

## 2023-12-01 DIAGNOSIS — I10 Essential (primary) hypertension: Secondary | ICD-10-CM | POA: Diagnosis not present

## 2023-12-01 DIAGNOSIS — Z79899 Other long term (current) drug therapy: Secondary | ICD-10-CM

## 2023-12-01 DIAGNOSIS — I2510 Atherosclerotic heart disease of native coronary artery without angina pectoris: Secondary | ICD-10-CM

## 2023-12-01 MED ORDER — HYDRALAZINE HCL 10 MG PO TABS: ORAL_TABLET | ORAL | 0 refills | Status: DC

## 2023-12-01 MED ORDER — HYDRALAZINE HCL 25 MG PO TABS: 25.0000 mg | ORAL_TABLET | Freq: Every morning | ORAL | Status: DC

## 2023-12-01 NOTE — Telephone Encounter (Signed)
**Note De-identified  [place] Obfuscation** Please advise 

## 2023-12-01 NOTE — Patient Instructions (Signed)
 Medication Instructions:   CHANGE Hydralazine  one ( 1) tablet by mouth ( 25 mg) in the morning.   START Hydralazine  one (1) tablet by mouth ( 10 mg) twice daily, at lunch time and dinner.   *If you need a refill on your cardiac medications before your next appointment, please call your pharmacy*  Lab Work:  Your physician recommends that you return for a FASTING lipid profile/tsh/cmet, fasting after midnight at your convenience.  Paperwork given to patient today.    If you have labs (blood work) drawn today and your tests are completely normal, you will receive your results only by: MyChart Message (if you have MyChart) OR A paper copy in the mail If you have any lab test that is abnormal or we need to change your treatment, we will call you to review the results.  Testing/Procedures:  Your physician has requested that you have an echocardiogram. Echocardiography is a painless test that uses sound waves to create images of your heart. It provides your doctor with information about the size and shape of your heart and how well your heart's chambers and valves are working. This procedure takes approximately one hour. There are no restrictions for this procedure. Please do NOT wear cologne, perfume or lotions (deodorant is allowed). Please arrive 15 minutes prior to your appointment time.  Please note: We ask at that you not bring children with you during ultrasound (echo/ vascular) testing. Due to room size and safety concerns, children are not allowed in the ultrasound rooms during exams. Our front office staff cannot provide observation of children in our lobby area while testing is being conducted. An adult accompanying a patient to their appointment will only be allowed in the ultrasound room at the discretion of the ultrasound technician under special circumstances. We apologize for any inconvenience./  Follow-Up: At [facility], you and your health needs are our priority.  As  part of our continuing mission to provide you with exceptional heart care, our providers are all part of one team.  This team includes your primary Cardiologist (physician) and Advanced Practice Providers or APPs (Physician Assistants and Nurse Practitioners) who all work together to provide you with the care you need, when you need it.  Your next appointment:   6 month(s)  Provider:   Slater Duncan, NP    We recommend signing up for the patient portal called "MyChart".  Sign up information is provided on this After Visit Summary.  MyChart is used to connect with patients for Virtual Visits (Telemedicine).  Patients are able to view lab/test results, encounter notes, upcoming appointments, etc.  Non-urgent messages can be sent to your provider as well.   To learn more about what you can do with MyChart, go to [email or web address].   Other Instructions  Your physician wants you to follow-up in: 6 months.  You will receive a reminder letter in the mail two months in advance. If you don't receive a letter, please call our office to schedule the follow-up appointment.

## 2023-12-09 ENCOUNTER — Encounter (HOSPITAL_BASED_OUTPATIENT_CLINIC_OR_DEPARTMENT_OTHER): Payer: Self-pay

## 2023-12-26 ENCOUNTER — Other Ambulatory Visit (HOSPITAL_BASED_OUTPATIENT_CLINIC_OR_DEPARTMENT_OTHER): Payer: Self-pay | Admitting: Nurse Practitioner

## 2024-01-26 ENCOUNTER — Other Ambulatory Visit (HOSPITAL_BASED_OUTPATIENT_CLINIC_OR_DEPARTMENT_OTHER)

## 2024-01-29 ENCOUNTER — Other Ambulatory Visit: Payer: Self-pay | Admitting: Cardiovascular Disease

## 2024-06-20 ENCOUNTER — Encounter (HOSPITAL_BASED_OUTPATIENT_CLINIC_OR_DEPARTMENT_OTHER): Payer: Self-pay

## 2024-06-21 NOTE — Telephone Encounter (Signed)
 Agree with Rosaline that Hydralazine  nor Flecainide  would be impacting renal function.   As she is overdue for follow up, will you please call and offer her 11/25 at 8:25s or 8:50am or 1:55p or 2:20p with Rosaline?  Thanks,  Reche GORMAN Finder, NP

## 2024-06-21 NOTE — Telephone Encounter (Signed)
 Spoke with the pt and endorsed recommendations.   Provided availability with Rosaline Bane, NP on 11/25 with times open for that day.  Pt states she is not at home at this time and request that I mychart her the times and she will message us  back to confirm appt date/time.   Will forward to triage pool to have when pt messages back.

## 2024-06-21 NOTE — Telephone Encounter (Signed)
 Spoke with patient regarding appointment  She is anxious about an appointment and kidney's Would like to be seen soon    Advised would call back tomorrow and get appointment scheduled

## 2024-06-25 NOTE — Progress Notes (Unsigned)
 Cardiology Office Note:  .   Date:  06/26/2024  ID:  Deborah Logan, DOB 1945-09-14, MRN [id number] PCP: Leonel Cole, MD  [facility] Providers Cardiologist:  Annabella Scarce, MD Cardiology APP:  Percy Rosaline HERO, NP    Patient Profile: .      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  She reported missing her home medications. EKG showed no acute changes but did note first-degree AV block with ventricular bigeminy, mild D-dimer elevation.  Coronary CT was negative for acute PE.  CXR was negative for cardiopulmonary abnormality.  BNP elevated at 928.8 and troponins trended 18 >> 21.  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  Her atenolol  was changed to carvedilol  and she was advised to take amlodipine  10 mg daily.  At follow-up visit BP remained elevated and carvedilol  was increased to 37.5 mg twice daily.  She reported having dizziness with this dose.  At follow-up visit with Dr. Alveta on 02/01/2023 she was advised to reduce carvedilol  to 25 mg twice daily and increase valsartan  to 160 mg daily.    At office visit with Dr. Alveta 05/06/2023 she was having frequent PVCs.  BP was elevated, felt to be a result of frequent PVCs.  She was started on flecainide  50 mg twice daily.  There were multiple messages regarding medication changes and she was referred to hypertension clinic.  Seen by Josette Mink, RPH on 05/24/2023 for management of hypertension.  At that time she was taking amlodipine  10 mg daily, carvedilol  6.25 mg twice daily, chlorthalidone  25 mg daily, and valsartan  320 mg daily. BP in the office was 161/61 mmHg.  No medication changes were made. BMP ordered for 1 week later. She was encouraged to discontinue Gatorade and drink plain water.  Low-sodium diet and regular exercise were emphasized and follow-up visit was scheduled.  She contacted our office on 06/01/23 to report fatigue. She was advised to stop carvedilol  and start metoprolol  succinate 50 mg daily to see if this helps improve fatigue.    At clinic visit 06/27/23 with Kristin Alvstad, [facility],  current medication regimen was: amlodipine  10 mg daily in the AM, chlorthalidone  25 mg daily in the AM, metop succinate 25 mg daily, and valsartan  320 mg daily in the PM. She did not tolerate hydralazine  due to diarrhea and stomach cramping or diuretics 2/2 hyponatremia. No significant caffeine consumption and no Etoh or tobacco use. Home BP cuff within 10 points of office device with average 152-178/61-78 (previous average was 183/78). Sodium remained low at 125 on labs 06/23/23. Clinic BP was 161/50 mmHg, HR 40 bpm (home readings 40-60 bpm). She was advised to stop chlorthalidone  2/2 hyponatremia and stop metoprolol  2/2 bradycardia.  She was advised to start nebivolol  2.5 mg once daily and doxazosin  2 mg once daily at bedtime and to continue amlodipine  10 mg and valsartan  320 mg both once daily.  Seen 07/22/23 by Allean Mink, RPH. She was accompanied by her daughter visiting from [place] . She continued to complain of shortness of breath and fatigue but reported fatigued improved on lower dose of valsartan . On 07/20/2023, valsartan  was reduced to 160 mg daily secondary  to fatigue. Clinic BP was 141/50 mmHg.  She was advised to increase doxazosin  to 4 mg nightly on 08/03/2023 and keep a record of BP and pulse readings for follow-up appointment.  Seen in follow-up by me on 09/26/23 and reported feeling better with more energy since discontinuing beta blockers. She was fatigued to the point of exhaustion and did not feel motivated to exercise while on BB. She also  unfortunately had COVID infection 08/02/23. She has not monitored BP as regularly since her illness.  She increased doxazosin  to 4 mg 08/03/2023 as instructed. Reported infrequent waves of dizziness since increasing the dosage of doxazosin  but no presyncope or syncope. Admitted not eating a healthy diet. She does not have a hearty appetite and often skips meals. She has lost 25 pounds in the past year but struggles with trying to lose more weight and exercise consistently.  Has a stationary bike at home that she has not been using recently.  Previously consistently attended yoga but has missed for the past couple of months, plans to resume exercise in 2 days. Home BP typically 140s-150s over 70s.  Says she is feeling the best today that she has felt in a long time. No  chest pain, shortness of breath, palpitations, edema, orthopnea, PND. We discussed consideration of testing of secondary causes of resistant HTN, however she would like to hold off for now. Plan to continue current anti-hypertensive regimen with amlodipine  10 mg every morning, valsartan  160 mg every evening, and doxazosin  4 mg at bedtime.    Lab results on 09/28/23 revealed Scr 1.53, potassium 5.3 and she was advised to discontinue valsartan  and ensure good hydration with at least 64 ounces of water daily.  Once off valsartan , BMP revealed SCR 1.34 and K4.7 on 10/13/2023.  Due to continued BP elevation, she was advised to start hydralazine  25 mg twice daily.  She previously had GI upset on hydralazine  but was asked to retry due to recent increase in creatinine.  Last office visit with me on 12/01/23 for follow-up of hypertension. She experiences anxiety 2/2 BP which fluctuates and is difficult to manage, especially on stressful days. Home BP readings have improved since she discontinued valsartan  and started hydralazine  twice daily.  Majority of SBP in 140s, but she also has readings in 120s-130s. She received distressing news yesterday and had higher  than average BP. She takes hydralazine  twice daily, although previously advised to take it three times a day. Feeling well on current therapy. She reports occasional dizziness but no presyncope or syncope. She is exercising regularly with yoga three times a week and riding a stationary bike, which she finds beneficial for her mental health. No noted correlation between exercise and her BP readings.  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  She no longer sees a nephrologist. Newly detected heart murmur is present, which was not noted in previous evaluations. Experiencing a hard heartbeat and occasional palpitations, which she associates with her medication. No chest pain, shortness of breath, edema, orthopnea, and PND.      History of Present Illness: .    History of Present Illness Deborah Logan is a very pleasant 78 year old female who presents today for follow-up of hypertension. She is concerned about her medications following a recent visit with PCP.  Her serum creatinine on lab work from 06/18/2024 was 1.88 with eGFR of 27.  Creatinine was 1.340 on 10/13/2023.  She is scheduled to see a nephrologist  on December 31. She is very concerned about worsening kidney function and cardiac medications. BP is significantly elevated in clinic. She has not taken her medications today and did not sleep well last night. Current regimen includes hydralazine  10 mg in the morning and evening, doxazosin  4 mg at bedtime, and amlodipine  10 mg daily for blood pressure, and flecainide  50 mg twice daily for PVCs. She reduced hydralazine  because of dizziness, with BP remaining stable. Has anxiety with episodes of heart racing, which she associates with hydralazine , and has had side effects from several medications. She notes occasional exertional shortness of breath that does not limit activity and denies chest pain. No orthopnea, edema or PND. She practices yoga three times per week. We  discussed factors that can contribute to abnormal lab results including muscle use, dark soda, and NSAIDs.  She admits to drinking one Coke zero daily and otherwise drinks 1 cup of coffee and at least 48 ounces of water daily.  She does not use NSAIDs.  She had recently been painting and was feeling muscular is comfort in her shoulder and back.  She wonders if this contributes to elevated creatinine.   Current anti-hypertensive regimen:  Amlodipine  10 mg daily - AM Doxazosin  4 mg at bedtime Hydralazine  25 mg BID - Changing to hydralazine  25 mg in the AM, 10 mg midday and evening  Intolerances: Valsartan  - elevated creatinine, hyperkalemia Beta blockers  Discussed the use of AI scribe software for clinical note transcription with the patient, who gave verbal consent to proceed.   ROS: See HPI       Studies Reviewed: .         Risk Assessment/Calculations:     HYPERTENSION CONTROL Vitals:   06/26/24 0815 06/26/24 0859  BP: (!) 168/60 (!) 150/68    The patient's blood pressure is elevated above target today.  In order to address the patient's elevated BP: Blood pressure will be monitored at home to determine if medication changes need to be made.          Physical Exam:   VS:  BP (!) 150/68   Pulse (!) 109   Ht 5' 4 (1.626 m)   Wt 175 lb 1.6 oz (79.4 kg)   SpO2 97%   BMI 30.06 kg/m    Wt Readings from Last 3 Encounters:  06/26/24 175 lb 1.6 oz (79.4 kg)  12/01/23 179 lb 4.8 oz (81.3 kg)  09/26/23 177 lb 12.8 oz (80.6 kg)    GEN: Well nourished, well developed in no acute distress NECK: No JVD; No carotid bruits CARDIAC: RRR, no murmurs, rubs, gallops RESPIRATORY:  Clear to auscultation without rales, wheezing or rhonchi  ABDOMEN: Soft, non-tender, non-distended EXTREMITIES:  No edema; No deformity     ASSESSMENT AND PLAN: .    Resistant hypertension CKD 3b-4 History of acute nephritis in the 1990s.  Recent labs with PCP revealed worsening kidney function with  serum creatinine of 1.88 and eGFR of 27.  She has been referred to nephrology and has an appointment on 08/01/2024.  There was some discussion of discontinuing medications, but she is  feeling well on current regimen.  She has discontinued hydralazine  25 mg which she was previously taking once a day and is now just taking hydralazine  10 mg twice a day. BP initially elevated in clinic with slight improvement on my recheck.  Home BP log reveals BP ranging from 120s to 140s with occasional readings > 140/88 mmHg.  Checking her blood pressure at  home causes her anxiety.  She expresses association with her husband's death, which has obviously caused her significant grief. Home monitor has been calibrated with manual cuff in the office. Avoid taking BP when feeling particularly anxious.  - Continue routine home monitoring for goal BP < 140/88 - Take BP readings to nephrology appointment - Continue hydralazine , doxazosin  and amlodipine  - Notify us  of any changes that are recommended by nephrology  Frequent PVCs High risk medication use Quiescent at this time. She is concerned about taking flecainide  and its impact on kidney function. Advised decreased flecainide  dose is not indicated based on kidney function given that she is on low dose. Intolerant to multiple beta-blockers due to fatigue and bradycardia. HR is well controlled. I mentioned referral to EP but she would like to continue follow-up with general radiology.  - Continue flecainide  at current dose of 50 mg twice daily  Coronary calcification Hyperlipidemia LDL goal < 70 Mild coronary artery calcification noted on CT 06/2022. She denies chest pain, dyspnea, or other symptoms concerning for angina. No indication for further ischemic evaluation at this time.  Lipid panel completed 06/20/2024 with total cholesterol 204, HDL 82, triglycerides 131, and LDL-C 100.  She is tolerating rosuvastatin  5 mg daily without concerning side effects.  Has been hesitant  to take additional lipid-lowering therapy. -Discussed potential for additional lipid-lowering therapy at next appointment - Focus on secondary prevention including heart healthy mostly plant based diet avoiding saturated fat, processed foods, simple carbohydrates, and sugar along  - Continue to aim for at least 150 minutes of moderate intensity exercise each week        Disposition:4 months with me  Signed, Rosaline Bane, NP-C

## 2024-06-26 ENCOUNTER — Ambulatory Visit (INDEPENDENT_AMBULATORY_CARE_PROVIDER_SITE_OTHER): Admitting: Nurse Practitioner

## 2024-06-26 ENCOUNTER — Encounter (HOSPITAL_BASED_OUTPATIENT_CLINIC_OR_DEPARTMENT_OTHER): Payer: Self-pay | Admitting: Nurse Practitioner

## 2024-06-26 VITALS — BP 150/68 | HR 109 | Ht 64.0 in | Wt 175.1 lb

## 2024-06-26 DIAGNOSIS — Z79899 Other long term (current) drug therapy: Secondary | ICD-10-CM | POA: Diagnosis not present

## 2024-06-26 DIAGNOSIS — I493 Ventricular premature depolarization: Secondary | ICD-10-CM

## 2024-06-26 DIAGNOSIS — N1832 Chronic kidney disease, stage 3b: Secondary | ICD-10-CM | POA: Diagnosis not present

## 2024-06-26 DIAGNOSIS — E785 Hyperlipidemia, unspecified: Secondary | ICD-10-CM

## 2024-06-26 DIAGNOSIS — I2510 Atherosclerotic heart disease of native coronary artery without angina pectoris: Secondary | ICD-10-CM

## 2024-06-26 DIAGNOSIS — I1A0 Resistant hypertension: Secondary | ICD-10-CM | POA: Diagnosis not present

## 2024-06-26 NOTE — Patient Instructions (Signed)
 Medication Instructions:   Your physician recommends that you continue on your current medications as directed. Please refer to the Current Medication list given to you today.   *If you need a refill on your cardiac medications before your next appointment, please call your pharmacy*  Lab Work:  None ordered.  If you have labs (blood work) drawn today and your tests are completely normal, you will receive your results only by: MyChart Message (if you have MyChart) OR A paper copy in the mail If you have any lab test that is abnormal or we need to change your treatment, we will call you to review the results.  Testing/Procedures:  None ordered.  Follow-Up: At [facility], you and your health needs are our priority.  As part of our continuing mission to provide you with exceptional heart care, our providers are all part of one team.  This team includes your primary Cardiologist (physician) and Advanced Practice Providers or APPs (Physician Assistants and Nurse Practitioners) who all work together to provide you with the care you need, when you need it.  Your next appointment:   4 month(s)  Provider:   Rosaline Bane, NP    We recommend signing up for the patient portal called MyChart.  Sign up information is provided on this After Visit Summary.  MyChart is used to connect with patients for Virtual Visits (Telemedicine).  Patients are able to view lab/test results, encounter notes, upcoming appointments, etc.  Non-urgent messages can be sent to your provider as well.   To learn more about what you can do with MyChart, go to [email or web address].

## 2024-07-09 ENCOUNTER — Encounter: Payer: Self-pay | Admitting: Family Medicine

## 2024-07-09 ENCOUNTER — Other Ambulatory Visit: Payer: Self-pay | Admitting: Family Medicine

## 2024-07-09 DIAGNOSIS — N289 Disorder of kidney and ureter, unspecified: Secondary | ICD-10-CM

## 2024-07-31 ENCOUNTER — Other Ambulatory Visit: Payer: Self-pay | Admitting: Cardiovascular Disease

## 2024-07-31 ENCOUNTER — Encounter (HOSPITAL_BASED_OUTPATIENT_CLINIC_OR_DEPARTMENT_OTHER): Payer: Self-pay

## 2024-08-01 ENCOUNTER — Other Ambulatory Visit: Payer: Self-pay | Admitting: Cardiovascular Disease

## 2024-08-01 MED ORDER — FLECAINIDE ACETATE 50 MG PO TABS: 50.0000 mg | ORAL_TABLET | Freq: Two times a day (BID) | ORAL | 3 refills | Status: AC

## 2024-10-04 ENCOUNTER — Ambulatory Visit (HOSPITAL_BASED_OUTPATIENT_CLINIC_OR_DEPARTMENT_OTHER): Admitting: Nurse Practitioner
